# Patient Record
Sex: Female | Born: 1945 | Race: White | Hispanic: No | Marital: Married | Smoking: Never smoker
Health system: Southern US, Community
[De-identification: ages and names within clinical notes are randomized; demographics above are authoritative.]

## PROBLEM LIST (undated history)

## (undated) DIAGNOSIS — Z87442 Personal history of urinary calculi: Secondary | ICD-10-CM

## (undated) HISTORY — PX: CYSTOSCOPY/URETEROSCOPY/HOLMIUM LASER/STENT PLACEMENT: SHX6546

## (undated) HISTORY — PX: ABDOMINAL HYSTERECTOMY: SHX81

---

## 2010-08-01 ENCOUNTER — Other Ambulatory Visit (INDEPENDENT_AMBULATORY_CARE_PROVIDER_SITE_OTHER): Payer: Self-pay | Admitting: *Deleted

## 2010-11-21 ENCOUNTER — Ambulatory Visit (HOSPITAL_COMMUNITY)
Admission: RE | Admit: 2010-11-21 | Discharge: 2010-11-21 | Disposition: A | Discharge status: Home / Self Care | Payer: Medicare Other | Source: Ambulatory Visit | Attending: Internal Medicine | Admitting: Internal Medicine

## 2010-11-21 ENCOUNTER — Encounter (HOSPITAL_BASED_OUTPATIENT_CLINIC_OR_DEPARTMENT_OTHER): Payer: Medicare Other | Admitting: Internal Medicine

## 2010-11-21 DIAGNOSIS — Z1211 Encounter for screening for malignant neoplasm of colon: Secondary | ICD-10-CM | POA: Insufficient documentation

## 2010-11-21 DIAGNOSIS — K6289 Other specified diseases of anus and rectum: Secondary | ICD-10-CM

## 2010-11-21 DIAGNOSIS — K648 Other hemorrhoids: Secondary | ICD-10-CM

## 2010-12-10 NOTE — Op Note (Signed)
  [name], [name]               ACCOUNT NO.:  [id number]  MEDICAL RECORD NO.:  [id number]           PATIENT TYPE:  O  LOCATION:  DAYP                          FACILITY:  [facility]  PHYSICIAN:  Lionel December, M.D.    DATE OF BIRTH:  [birth year]  DATE OF PROCEDURE:  11/21/2010 DATE OF DISCHARGE:                              OPERATIVE REPORT   PROCEDURE:  Colonoscopy.  INDICATION:  Cheryl Wagner is a 65 year old Caucasian female who is undergoing average risk screening colonoscopy.  Her last colonoscopy was over 12 years ago.  Procedure risks were reviewed with the patient.  Informed consent was obtained.  MEDICATIONS FOR CONSCIOUS SEDATION:  Demerol 50 mg IV and Versed 4 mg IV.  FINDINGS:  Procedure performed in endoscopy suite.  The patient's vital signs and O2 sat were monitored during the procedure and remained stable.  The patient was placed in the left lateral recumbent position and rectal examination was performed.  No abnormality noted on external or digital exam.  Pentax videoscope was placed through rectum and advanced under vision into sigmoid colon and beyond.  Preparation was excellent.  Scope was passed into cecum which was identified by appendiceal orifice and ileocecal valve.  Short segment of GI was also examined, it was normal.  Pictures were taken for the record.  As the scope was withdrawn, colonic mucosa was carefully examined and no polyps, tumor masses or diverticular changes were noted.  Rectal mucosa was normal.  Scope was retroflexed to examine anorectal junction and she had two small anal papillae and hemorrhoids above the dentate line. Endoscope was then withdrawn.  Withdrawal time was 10 minutes.  The patient tolerated the procedure well.  FINAL DIAGNOSIS:  Small internal hemorrhoids with 2 anal papillae, otherwise normal colonoscopy.  RECOMMENDATIONS: 1. High-fiber diet. 2. She may consider next screening exam in 10 years.     ______________________________ Lionel December, M.D.     NR/MEDQ  D:  11/21/2010  T:  11/21/2010  Job:  [id number]  cc:   Dr. Toney Reil, [place]  Electronically Signed by Lionel December M.D. on 12/10/2010 10:09:54 AM

## 2011-01-02 NOTE — Progress Notes (Signed)
error 

## 2013-09-07 ENCOUNTER — Encounter (HOSPITAL_COMMUNITY): Payer: Self-pay | Admitting: Emergency Medicine

## 2013-09-07 ENCOUNTER — Emergency Department (HOSPITAL_COMMUNITY)
Admission: EM | Admit: 2013-09-07 | Discharge: 2013-09-07 | Disposition: A | Discharge status: Home / Self Care | Payer: Medicare Other | Attending: Emergency Medicine | Admitting: Emergency Medicine

## 2013-09-07 DIAGNOSIS — Z4689 Encounter for fitting and adjustment of other specified devices: Secondary | ICD-10-CM

## 2013-09-07 DIAGNOSIS — M25539 Pain in unspecified wrist: Secondary | ICD-10-CM | POA: Insufficient documentation

## 2013-09-07 NOTE — ED Provider Notes (Signed)
CSN: [id number]     Arrival date & time 09/07/13  1421 History   First MD Initiated Contact with Patient 09/07/13 1459     Chief Complaint  Patient presents with  . Cast Problem     (Consider location/radiation/quality/duration/timing/severity/associated sxs/prior Treatment) HPI Comments: Cheryl Wagner is a 68 y.o. Female presenting with assistance with her cast.  She is being treated by Dr. Chaney MallingMortenson in [place] for a distal radial fracture of her left arm and has been in her current cast for the past 3 weeks.  She has developed increased swelling and pain in the hand and wrist this week which is constant and worsening.  She has tried to elevate the arm as much as possible without relief of this pain.  She has noticed discoloration of her fingers today.  She denies new injury.  She is supposed to see Dr. Chaney MallingMortenson in 5 days for a change to a short arm cast; she was unable to be seen by him today.     The history is provided by the patient.    History reviewed. No pertinent past medical history. Past Surgical History  Procedure Laterality Date  . Abdominal hysterectomy     No family history on file. History  Substance Use Topics  . Smoking status: Never Smoker   . Smokeless tobacco: Not on file  . Alcohol Use: Not on file   OB History   Grav Para Term Preterm Abortions TAB SAB Ect Mult Living                 Review of Systems  Constitutional: Negative for fever.  Musculoskeletal: Positive for arthralgias. Negative for myalgias.  Skin: Positive for color change.  Neurological: Negative for weakness and numbness.      Allergies  Codeine  Home Medications  No current outpatient prescriptions on file. BP 143/82  Pulse 75  Temp(Src) 97.9 F (36.6 C) (Oral)  Resp 18  Ht 5\' 3"  (1.6 m)  Wt 110 lb (49.896 kg)  BMI 19.49 kg/m2  SpO2 100% Physical Exam  Constitutional: She appears well-developed and well-nourished.  HENT:  Head: Atraumatic.  Neck: Normal range of  motion.  Cardiovascular:  Pulses equal bilaterally  Musculoskeletal: She exhibits edema and tenderness.  Pt presents in a left sugar tong cast.  Her fingers and distal hand have pitting edema. Cap refill is reduced at 6 seconds.  Dusky, mottled appearance of volar proximal fingers.    Neurological: She is alert. She has normal strength. She displays normal reflexes. No sensory deficit.  Skin: Skin is warm and dry.  Psychiatric: She has a normal mood and affect.    ED Course  Procedures (including critical care time)  Patients old cast was cut off.  New well padded sugar tong splint applied after edema and peripheral color returned to normal. Pt has less than 3 sec cap refill in fingertips with full radial pulse.  Distal sensation intact.  She has tenderness dorsal distal radius without deformity.     Labs Review Labs Reviewed - No data to display Imaging Review No results found.   EKG Interpretation None      MDM   Final diagnoses:  Encounter for cast check    Call placed to Dr. Chaney MallingMortenson.  Advised of pt presentation.  He recommended replacement of well padded sugar tong,  Will plan to see pt Monday as previously scheduled.  Pt was also given a sling to help with elevation.    The patient  appears reasonably screened and/or stabilized for discharge and I doubt any other medical condition or other [facility] requiring further screening, evaluation, or treatment in the ED at this time prior to discharge.     Burgess Amor, PA-C 09/07/13 2045

## 2013-09-07 NOTE — ED Notes (Signed)
Swelling of fingers left hand, has cast in place, cast applied in [place] by Dr Jon BillingsMorrison. Cast applied 3 weeks ago.

## 2013-09-08 NOTE — ED Provider Notes (Signed)
Medical screening examination/treatment/procedure(s) were performed by non-physician practitioner and as supervising physician I was immediately available for consultation/collaboration.   EKG Interpretation None       Cheryl HutchingBrian Damika Harmon, MD 09/08/13 0045

## 2015-07-09 DIAGNOSIS — H2512 Age-related nuclear cataract, left eye: Secondary | ICD-10-CM | POA: Diagnosis not present

## 2015-07-09 DIAGNOSIS — H25012 Cortical age-related cataract, left eye: Secondary | ICD-10-CM | POA: Diagnosis not present

## 2015-07-17 DIAGNOSIS — H2512 Age-related nuclear cataract, left eye: Secondary | ICD-10-CM | POA: Diagnosis not present

## 2016-05-01 DIAGNOSIS — H26493 Other secondary cataract, bilateral: Secondary | ICD-10-CM | POA: Diagnosis not present

## 2016-05-01 DIAGNOSIS — Z961 Presence of intraocular lens: Secondary | ICD-10-CM | POA: Diagnosis not present

## 2016-05-01 DIAGNOSIS — H1131 Conjunctival hemorrhage, right eye: Secondary | ICD-10-CM | POA: Diagnosis not present

## 2016-06-02 DIAGNOSIS — Z299 Encounter for prophylactic measures, unspecified: Secondary | ICD-10-CM | POA: Diagnosis not present

## 2016-06-02 DIAGNOSIS — Z6821 Body mass index (BMI) 21.0-21.9, adult: Secondary | ICD-10-CM | POA: Diagnosis not present

## 2016-06-02 DIAGNOSIS — Z1211 Encounter for screening for malignant neoplasm of colon: Secondary | ICD-10-CM | POA: Diagnosis not present

## 2016-06-02 DIAGNOSIS — E7800 Pure hypercholesterolemia, unspecified: Secondary | ICD-10-CM | POA: Diagnosis not present

## 2016-06-02 DIAGNOSIS — Z0000 Encounter for general adult medical examination without abnormal findings: Secondary | ICD-10-CM | POA: Diagnosis not present

## 2016-06-02 DIAGNOSIS — R5383 Other fatigue: Secondary | ICD-10-CM | POA: Diagnosis not present

## 2016-06-02 DIAGNOSIS — Z1389 Encounter for screening for other disorder: Secondary | ICD-10-CM | POA: Diagnosis not present

## 2016-06-02 DIAGNOSIS — Z7189 Other specified counseling: Secondary | ICD-10-CM | POA: Diagnosis not present

## 2016-06-04 DIAGNOSIS — Z23 Encounter for immunization: Secondary | ICD-10-CM | POA: Diagnosis not present

## 2016-06-11 DIAGNOSIS — E2839 Other primary ovarian failure: Secondary | ICD-10-CM | POA: Diagnosis not present

## 2016-06-18 DIAGNOSIS — M8580 Other specified disorders of bone density and structure, unspecified site: Secondary | ICD-10-CM | POA: Diagnosis not present

## 2016-06-18 DIAGNOSIS — Z299 Encounter for prophylactic measures, unspecified: Secondary | ICD-10-CM | POA: Diagnosis not present

## 2016-06-18 DIAGNOSIS — E039 Hypothyroidism, unspecified: Secondary | ICD-10-CM | POA: Diagnosis not present

## 2016-06-18 DIAGNOSIS — E782 Mixed hyperlipidemia: Secondary | ICD-10-CM | POA: Diagnosis not present

## 2016-07-01 DIAGNOSIS — H43393 Other vitreous opacities, bilateral: Secondary | ICD-10-CM | POA: Diagnosis not present

## 2016-07-01 DIAGNOSIS — Z1231 Encounter for screening mammogram for malignant neoplasm of breast: Secondary | ICD-10-CM | POA: Diagnosis not present

## 2016-12-13 DIAGNOSIS — J029 Acute pharyngitis, unspecified: Secondary | ICD-10-CM | POA: Diagnosis not present

## 2016-12-13 DIAGNOSIS — J0100 Acute maxillary sinusitis, unspecified: Secondary | ICD-10-CM | POA: Diagnosis not present

## 2017-06-03 DIAGNOSIS — Z1231 Encounter for screening mammogram for malignant neoplasm of breast: Secondary | ICD-10-CM | POA: Diagnosis not present

## 2017-06-03 DIAGNOSIS — E7800 Pure hypercholesterolemia, unspecified: Secondary | ICD-10-CM | POA: Diagnosis not present

## 2017-06-03 DIAGNOSIS — Z299 Encounter for prophylactic measures, unspecified: Secondary | ICD-10-CM | POA: Diagnosis not present

## 2017-06-03 DIAGNOSIS — Z6821 Body mass index (BMI) 21.0-21.9, adult: Secondary | ICD-10-CM | POA: Diagnosis not present

## 2017-06-03 DIAGNOSIS — Z7189 Other specified counseling: Secondary | ICD-10-CM | POA: Diagnosis not present

## 2017-06-03 DIAGNOSIS — Z1339 Encounter for screening examination for other mental health and behavioral disorders: Secondary | ICD-10-CM | POA: Diagnosis not present

## 2017-06-03 DIAGNOSIS — Z0000 Encounter for general adult medical examination without abnormal findings: Secondary | ICD-10-CM | POA: Diagnosis not present

## 2017-06-03 DIAGNOSIS — Z23 Encounter for immunization: Secondary | ICD-10-CM | POA: Diagnosis not present

## 2017-06-03 DIAGNOSIS — Z1331 Encounter for screening for depression: Secondary | ICD-10-CM | POA: Diagnosis not present

## 2017-06-03 DIAGNOSIS — R5383 Other fatigue: Secondary | ICD-10-CM | POA: Diagnosis not present

## 2017-06-03 DIAGNOSIS — Z1211 Encounter for screening for malignant neoplasm of colon: Secondary | ICD-10-CM | POA: Diagnosis not present

## 2017-06-10 DIAGNOSIS — Z23 Encounter for immunization: Secondary | ICD-10-CM | POA: Diagnosis not present

## 2018-06-04 DIAGNOSIS — Z23 Encounter for immunization: Secondary | ICD-10-CM | POA: Diagnosis not present

## 2018-06-09 DIAGNOSIS — Z1211 Encounter for screening for malignant neoplasm of colon: Secondary | ICD-10-CM | POA: Diagnosis not present

## 2018-06-09 DIAGNOSIS — Z1331 Encounter for screening for depression: Secondary | ICD-10-CM | POA: Diagnosis not present

## 2018-06-09 DIAGNOSIS — Z6821 Body mass index (BMI) 21.0-21.9, adult: Secondary | ICD-10-CM | POA: Diagnosis not present

## 2018-06-09 DIAGNOSIS — Z79899 Other long term (current) drug therapy: Secondary | ICD-10-CM | POA: Diagnosis not present

## 2018-06-09 DIAGNOSIS — R5383 Other fatigue: Secondary | ICD-10-CM | POA: Diagnosis not present

## 2018-06-09 DIAGNOSIS — Z299 Encounter for prophylactic measures, unspecified: Secondary | ICD-10-CM | POA: Diagnosis not present

## 2018-06-09 DIAGNOSIS — E039 Hypothyroidism, unspecified: Secondary | ICD-10-CM | POA: Diagnosis not present

## 2018-06-09 DIAGNOSIS — Z1339 Encounter for screening examination for other mental health and behavioral disorders: Secondary | ICD-10-CM | POA: Diagnosis not present

## 2018-06-09 DIAGNOSIS — E7800 Pure hypercholesterolemia, unspecified: Secondary | ICD-10-CM | POA: Diagnosis not present

## 2018-06-09 DIAGNOSIS — Z0000 Encounter for general adult medical examination without abnormal findings: Secondary | ICD-10-CM | POA: Diagnosis not present

## 2018-06-09 DIAGNOSIS — Z7189 Other specified counseling: Secondary | ICD-10-CM | POA: Diagnosis not present

## 2018-07-29 DIAGNOSIS — E2839 Other primary ovarian failure: Secondary | ICD-10-CM | POA: Diagnosis not present

## 2018-09-09 DIAGNOSIS — H02055 Trichiasis without entropian left lower eyelid: Secondary | ICD-10-CM | POA: Diagnosis not present

## 2019-05-06 DIAGNOSIS — Z23 Encounter for immunization: Secondary | ICD-10-CM | POA: Diagnosis not present

## 2020-04-06 ENCOUNTER — Emergency Department (HOSPITAL_COMMUNITY): Payer: Medicare Other | Admitting: Anesthesiology

## 2020-04-06 ENCOUNTER — Emergency Department (HOSPITAL_COMMUNITY): Payer: Medicare Other

## 2020-04-06 ENCOUNTER — Encounter (HOSPITAL_COMMUNITY): Payer: Self-pay

## 2020-04-06 ENCOUNTER — Other Ambulatory Visit: Payer: Self-pay

## 2020-04-06 ENCOUNTER — Encounter (HOSPITAL_COMMUNITY)
Admission: EM | Disposition: A | Discharge status: Home / Self Care | Payer: Self-pay | Source: Home / Self Care | Attending: Emergency Medicine

## 2020-04-06 ENCOUNTER — Observation Stay (HOSPITAL_COMMUNITY)
Admission: EM | Admit: 2020-04-06 | Discharge: 2020-04-07 | Disposition: A | Discharge status: Home / Self Care | Payer: Medicare Other | Attending: Urology | Admitting: Urology

## 2020-04-06 DIAGNOSIS — N201 Calculus of ureter: Principal | ICD-10-CM | POA: Diagnosis present

## 2020-04-06 DIAGNOSIS — R109 Unspecified abdominal pain: Secondary | ICD-10-CM

## 2020-04-06 DIAGNOSIS — R8281 Pyuria: Secondary | ICD-10-CM | POA: Insufficient documentation

## 2020-04-06 DIAGNOSIS — Z20822 Contact with and (suspected) exposure to covid-19: Secondary | ICD-10-CM | POA: Insufficient documentation

## 2020-04-06 HISTORY — PX: CYSTOSCOPY WITH STENT PLACEMENT: SHX5790

## 2020-04-06 LAB — CBC WITH DIFFERENTIAL/PLATELET
Abs Immature Granulocytes: 0.04 10*3/uL (ref 0.00–0.07)
Basophils Absolute: 0 10*3/uL (ref 0.0–0.1)
Basophils Relative: 0 %
Eosinophils Absolute: 0 10*3/uL (ref 0.0–0.5)
Eosinophils Relative: 0 %
HCT: 37.8 % (ref 36.0–46.0)
Hemoglobin: 12.8 g/dL (ref 12.0–15.0)
Immature Granulocytes: 0 %
Lymphocytes Relative: 13 %
Lymphs Abs: 1.3 10*3/uL (ref 0.7–4.0)
MCH: 30.9 pg (ref 26.0–34.0)
MCHC: 33.9 g/dL (ref 30.0–36.0)
MCV: 91.3 fL (ref 80.0–100.0)
Monocytes Absolute: 0.9 10*3/uL (ref 0.1–1.0)
Monocytes Relative: 9 %
Neutro Abs: 8.1 10*3/uL — ABNORMAL HIGH (ref 1.7–7.7)
Neutrophils Relative %: 78 %
Platelets: 271 10*3/uL (ref 150–400)
RBC: 4.14 MIL/uL (ref 3.87–5.11)
RDW: 13.5 % (ref 11.5–15.5)
WBC: 10.4 10*3/uL (ref 4.0–10.5)
nRBC: 0 % (ref 0.0–0.2)

## 2020-04-06 LAB — URINALYSIS, ROUTINE W REFLEX MICROSCOPIC
Bilirubin Urine: NEGATIVE
Glucose, UA: NEGATIVE mg/dL
Ketones, ur: NEGATIVE mg/dL
Nitrite: NEGATIVE
Protein, ur: NEGATIVE mg/dL
RBC / HPF: >50 RBC/hpf — ABNORMAL HIGH (ref 0–5)
Specific Gravity, Urine: 1.02 (ref 1.005–1.030)
pH: 5 (ref 5.0–8.0)

## 2020-04-06 LAB — RESPIRATORY PANEL BY RT PCR (FLU A&B, COVID)
Influenza A by PCR: NEGATIVE
Influenza B by PCR: NEGATIVE
SARS Coronavirus 2 by RT PCR: NEGATIVE

## 2020-04-06 LAB — BASIC METABOLIC PANEL
Anion gap: 12 (ref 5–15)
Anion gap: 8 (ref 5–15)
BUN: 11 mg/dL (ref 8–23)
BUN: 10 mg/dL (ref 8–23)
CO2: 26 mmol/L (ref 22–32)
CO2: 27 mmol/L (ref 22–32)
Calcium: 9.2 mg/dL (ref 8.9–10.3)
Calcium: 8.7 mg/dL — ABNORMAL LOW (ref 8.9–10.3)
Chloride: 99 mmol/L (ref 98–111)
Chloride: 105 mmol/L (ref 98–111)
Creatinine, Ser: 0.78 mg/dL (ref 0.44–1.00)
Creatinine, Ser: 0.73 mg/dL (ref 0.44–1.00)
GFR, Estimated: >60 mL/min (ref >60)
GFR, Estimated: >60 mL/min (ref >60)
Glucose, Bld: 108 mg/dL — ABNORMAL HIGH (ref 70–99)
Glucose, Bld: 121 mg/dL — ABNORMAL HIGH (ref 70–99)
Potassium: 3.7 mmol/L (ref 3.5–5.1)
Potassium: 3.7 mmol/L (ref 3.5–5.1)
Sodium: 137 mmol/L (ref 135–145)
Sodium: 140 mmol/L (ref 135–145)

## 2020-04-06 LAB — CBC
HCT: 36.7 % (ref 36.0–46.0)
Hemoglobin: 12.2 g/dL (ref 12.0–15.0)
MCH: 30.6 pg (ref 26.0–34.0)
MCHC: 33.2 g/dL (ref 30.0–36.0)
MCV: 92 fL (ref 80.0–100.0)
Platelets: 262 10*3/uL (ref 150–400)
RBC: 3.99 MIL/uL (ref 3.87–5.11)
RDW: 13.4 % (ref 11.5–15.5)
WBC: 9.5 10*3/uL (ref 4.0–10.5)
nRBC: 0 % (ref 0.0–0.2)

## 2020-04-06 SURGERY — CYSTOSCOPY, WITH STENT INSERTION
Anesthesia: General | Site: Ureter | Laterality: Left

## 2020-04-06 MED ORDER — DIPHENHYDRAMINE HCL 12.5 MG/5ML PO ELIX: 12.5000 mg | ORAL_SOLUTION | Freq: Four times a day (QID) | ORAL | Status: DC | PRN

## 2020-04-06 MED ORDER — IOHEXOL 300 MG/ML  SOLN
INTRAMUSCULAR | Status: DC | PRN
  Administered 2020-04-06: 6 mL

## 2020-04-06 MED ORDER — FENTANYL CITRATE (PF) 100 MCG/2ML IJ SOLN: 25.0000 ug | INTRAMUSCULAR | Status: DC | PRN

## 2020-04-06 MED ORDER — SODIUM CHLORIDE 0.9 % IV SOLN
2.0000 g | INTRAVENOUS | Status: DC
  Administered 2020-04-07: 2 g via INTRAVENOUS
  Filled 2020-04-06: qty 2

## 2020-04-06 MED ORDER — FENTANYL CITRATE (PF) 100 MCG/2ML IJ SOLN
INTRAMUSCULAR | Status: AC
  Filled 2020-04-06: qty 2

## 2020-04-06 MED ORDER — EPHEDRINE SULFATE-NACL 50-0.9 MG/10ML-% IV SOSY
PREFILLED_SYRINGE | INTRAVENOUS | Status: DC | PRN
  Administered 2020-04-06 (×2): 10 mg via INTRAVENOUS

## 2020-04-06 MED ORDER — MEPERIDINE HCL 50 MG/ML IJ SOLN: 6.2500 mg | INTRAMUSCULAR | Status: DC | PRN

## 2020-04-06 MED ORDER — STERILE WATER FOR IRRIGATION IR SOLN
Status: DC | PRN
  Administered 2020-04-06: 3000 mL

## 2020-04-06 MED ORDER — SODIUM CHLORIDE 0.9 % IV SOLN
1.0000 g | INTRAVENOUS | Status: DC
  Administered 2020-04-06: 1 g via INTRAVENOUS
  Filled 2020-04-06: qty 10

## 2020-04-06 MED ORDER — LIDOCAINE 2% (20 MG/ML) 5 ML SYRINGE
INTRAMUSCULAR | Status: DC | PRN
  Administered 2020-04-06: 100 mg via INTRAVENOUS

## 2020-04-06 MED ORDER — PROPOFOL 10 MG/ML IV BOLUS
INTRAVENOUS | Status: DC | PRN
  Administered 2020-04-06: 150 mg via INTRAVENOUS

## 2020-04-06 MED ORDER — ONDANSETRON HCL 4 MG/2ML IJ SOLN: 4.0000 mg | Freq: Once | INTRAMUSCULAR | Status: DC | PRN

## 2020-04-06 MED ORDER — LACTATED RINGERS IV SOLN: INTRAVENOUS | Status: DC | PRN

## 2020-04-06 MED ORDER — SODIUM CHLORIDE 0.9 % IV SOLN: INTRAVENOUS | Status: DC

## 2020-04-06 MED ORDER — DEXAMETHASONE SODIUM PHOSPHATE 10 MG/ML IJ SOLN
INTRAMUSCULAR | Status: DC | PRN
  Administered 2020-04-06: 4 mg via INTRAVENOUS

## 2020-04-06 MED ORDER — HYDROMORPHONE HCL 1 MG/ML IJ SOLN
0.5000 mg | Freq: Once | INTRAMUSCULAR | Status: AC
Start: 2020-04-06 — End: 2020-04-06
  Administered 2020-04-06: 0.5 mg via INTRAVENOUS
  Filled 2020-04-06: qty 1

## 2020-04-06 MED ORDER — ONDANSETRON HCL 4 MG/2ML IJ SOLN
INTRAMUSCULAR | Status: DC | PRN
  Administered 2020-04-06: 4 mg via INTRAVENOUS

## 2020-04-06 MED ORDER — DIPHENHYDRAMINE HCL 50 MG/ML IJ SOLN: 12.5000 mg | Freq: Four times a day (QID) | INTRAMUSCULAR | Status: DC | PRN

## 2020-04-06 MED ORDER — PHENYLEPHRINE 40 MCG/ML (10ML) SYRINGE FOR IV PUSH (FOR BLOOD PRESSURE SUPPORT)
PREFILLED_SYRINGE | INTRAVENOUS | Status: DC | PRN
  Administered 2020-04-06: 80 ug via INTRAVENOUS

## 2020-04-06 MED ORDER — ACETAMINOPHEN 325 MG PO TABS: 650.0000 mg | ORAL_TABLET | ORAL | Status: DC | PRN

## 2020-04-06 MED ORDER — ONDANSETRON HCL 4 MG/2ML IJ SOLN
4.0000 mg | Freq: Once | INTRAMUSCULAR | Status: AC
  Administered 2020-04-06: 4 mg via INTRAVENOUS
  Filled 2020-04-06: qty 2

## 2020-04-06 MED ORDER — ONDANSETRON HCL 4 MG/2ML IJ SOLN: 4.0000 mg | INTRAMUSCULAR | Status: DC | PRN

## 2020-04-06 MED ORDER — FENTANYL CITRATE (PF) 100 MCG/2ML IJ SOLN
INTRAMUSCULAR | Status: DC | PRN
  Administered 2020-04-06: 50 ug via INTRAVENOUS

## 2020-04-06 MED ORDER — BELLADONNA ALKALOIDS-OPIUM 16.2-60 MG RE SUPP: 1.0000 | Freq: Four times a day (QID) | RECTAL | Status: DC | PRN

## 2020-04-06 MED ORDER — HYDROCODONE-ACETAMINOPHEN 5-325 MG PO TABS: 1.0000 | ORAL_TABLET | ORAL | Status: DC | PRN

## 2020-04-06 SURGICAL SUPPLY — 12 items
BAG URO CATCHER STRL LF (MISCELLANEOUS) ×3 IMPLANT
CATH INTERMIT  6FR 70CM (CATHETERS) ×3 IMPLANT
CLOTH BEACON ORANGE TIMEOUT ST (SAFETY) ×3 IMPLANT
GLOVE BIOGEL M 8.0 STRL (GLOVE) ×3 IMPLANT
GOWN STRL REUS W/TWL XL LVL3 (GOWN DISPOSABLE) ×6 IMPLANT
GUIDEWIRE STR DUAL SENSOR (WIRE) ×3 IMPLANT
KIT TURNOVER KIT A (KITS) IMPLANT
MANIFOLD NEPTUNE II (INSTRUMENTS) ×3 IMPLANT
PACK CYSTO (CUSTOM PROCEDURE TRAY) ×3 IMPLANT
STENT URET 6FRX24 CONTOUR (STENTS) ×3 IMPLANT
TUBING CONNECTING 10 (TUBING) ×2 IMPLANT
TUBING CONNECTING 10' (TUBING) ×1

## 2020-04-06 NOTE — ED Notes (Signed)
Pt OTF to short stay

## 2020-04-06 NOTE — Anesthesia Procedure Notes (Addendum)
Procedure Name: LMA Insertion Date/Time: 04/06/2020 12:26 PM Performed by: Wynonia Sours, CRNA Pre-anesthesia Checklist: Patient identified, Emergency Drugs available, Suction available, Patient being monitored and Timeout performed Patient Re-evaluated:Patient Re-evaluated prior to induction Oxygen Delivery Method: Circle system utilized Preoxygenation: Pre-oxygenation with 100% oxygen Induction Type: IV induction LMA: LMA with gastric port inserted LMA Size: 3.0 Number of attempts: 1 Placement Confirmation: positive ETCO2 Tube secured with: Tape Dental Injury: Teeth and Oropharynx as per pre-operative assessment

## 2020-04-06 NOTE — ED Provider Notes (Signed)
[name] Provider Note   CSN: [id number] Arrival date & time: 04/06/20  5643     History Chief Complaint  Patient presents with  . Flank Pain    Cheryl Wagner is a 74 y.o. female.  74 year old female who was diagnosed with a 6 mm stone causing hydronephrosis yesterday presents with ongoing nausea and left leg pain.  Denies any dysuria or hematuria.  Has been using oxycodone with some relief.  No prior history of kidney stones.  Called her doctor and told to come in for further management        History reviewed. No pertinent past medical history.  There are no problems to display for this patient.   Past Surgical History:  Procedure Laterality Date  . ABDOMINAL HYSTERECTOMY       OB History   No obstetric history on file.     History reviewed. No pertinent family history.  Social History   Tobacco Use  . Smoking status: Never Smoker  . Smokeless tobacco: Never Used  Substance Use Topics  . Alcohol use: Not on file  . Drug use: Not on file    Home Medications Prior to Admission medications   Medication Sig Start Date End Date Taking? Authorizing Provider  HYDROcodone-acetaminophen (NORCO/VICODIN) 5-325 MG tablet Take 1 tablet by mouth 4 (four) times daily as needed for pain. 04/05/20   [provider]  metroNIDAZOLE (FLAGYL) 500 MG tablet Take 500 mg by mouth 2 (two) times daily. 7 day supply 04/05/20   [provider]    Allergies    Codeine  Review of Systems   Review of Systems  All other systems reviewed and are negative.   Physical Exam Updated Vital Signs BP 127/64 (BP Location: Left Arm)   Pulse 77   Temp 98.2 F (36.8 C) (Oral)   Resp 16   Ht 1.6 m (5\' 3" )   Wt 49 kg   SpO2 97%   BMI 19.14 kg/m   Physical Exam Vitals and nursing note reviewed.  Constitutional:      General: She is not in acute distress.    Appearance: Normal appearance. She is well-developed. She is not  toxic-appearing.  HENT:     Head: Normocephalic and atraumatic.  Eyes:     General: Lids are normal.     Conjunctiva/sclera: Conjunctivae normal.     Pupils: Pupils are equal, round, and reactive to light.  Neck:     Thyroid: No thyroid mass.     Trachea: No tracheal deviation.  Cardiovascular:     Rate and Rhythm: Normal rate and regular rhythm.     Heart sounds: Normal heart sounds. No murmur heard.  No gallop.   Pulmonary:     Effort: Pulmonary effort is normal. No respiratory distress.     Breath sounds: Normal breath sounds. No stridor. No decreased breath sounds, wheezing, rhonchi or rales.  Abdominal:     General: Bowel sounds are normal. There is no distension.     Palpations: Abdomen is soft.     Tenderness: There is no abdominal tenderness. There is no rebound.  Musculoskeletal:        General: No tenderness. Normal range of motion.     Cervical back: Normal range of motion and neck supple.  Skin:    General: Skin is warm and dry.     Findings: No abrasion or rash.  Neurological:     Mental Status: She is alert and oriented to  person, place, and time.     GCS: GCS eye subscore is 4. GCS verbal subscore is 5. GCS motor subscore is 6.     Cranial Nerves: No cranial nerve deficit.     Sensory: No sensory deficit.  Psychiatric:        Speech: Speech normal.        Behavior: Behavior normal.     ED Results / Procedures / Treatments   Labs (all labs ordered are listed, but only abnormal results are displayed) Labs Reviewed  URINE CULTURE  CBC WITH DIFFERENTIAL/PLATELET  BASIC METABOLIC PANEL  URINALYSIS, ROUTINE W REFLEX MICROSCOPIC    EKG None  Radiology No results found.  Procedures Procedures (including critical care time)  Medications Ordered in ED Medications  0.9 %  sodium chloride infusion (has no administration in time range)    ED Course  I have reviewed the triage vital signs and the nursing notes.  Pertinent labs & imaging results that  were available during my care of the patient were reviewed by me and considered in my medical decision making (see chart for details).    MDM Rules/Calculators/A&P                          Patient medicated for pain here.  Urinalysis shows infection.  Patient started on IV Rocephin.  Discussed with urologist on-call, Dr. Ronne Binning, who will come and see patient Final Clinical Impression(s) / ED Diagnoses Final diagnoses:  None    Rx / DC Orders ED Discharge Orders    None       Lorre Nick, MD 04/06/20 [phone]

## 2020-04-06 NOTE — Op Note (Signed)
.  Preoperative diagnosis: Left ureteral stone  Postoperative diagnosis: Same  Procedure: 1 cystoscopy 2. Left retrograde pyelography 3.  Intraoperative fluoroscopy, under one hour, with interpretation 4. Left 6 x 26 JJ stent placement  Attending: Wilkie Aye  Anesthesia: General  Estimated blood loss: None  Drains: Left 6 x 26 JJ ureteral stent without tether, 16 French foley catheter  Specimens: none  Antibiotics: rocephin  Findings: left proximal ureteral stone. Moderate hydronephrosis. Purulent drainage from left renal pelvis after stent placement. No masses/lesions in the bladder. Ureteral orifices in normal anatomic location.  Indications: Patient is a 74 year old female with a history of left ureteral stone and concern for sepsis.  After discussing treatment options, they decided proceed with left stent placement.  Procedure her in detail: The patient was brought to the operating room and a brief timeout was done to ensure correct patient, correct procedure, correct site.  General anesthesia was administered patient was placed in dorsal lithotomy position.  Their genitalia was then prepped and draped in usual sterile fashion.  A rigid 22 French cystoscope was passed in the urethra and the bladder.  Bladder was inspected free masses or lesions.  the ureteral orifices were in the normal orthotopic locations.  a 6 french ureteral catheter was then instilled into the left ureteral orifice.  a gentle retrograde was obtained and findings noted above.  we then placed a zip wire through the ureteral catheter and advanced up to the renal pelvis.    We then placed a 6 x 26 double-j ureteral stent over the original zip wire.  We then removed the wire and good coil was noted in the the renal pelvis under fluoroscopy and the bladder under direct vision.  A foley catheter was then placed. the bladder was then drained and this concluded the procedure which was well tolerated by  patient.  Complications: None  Condition: Stable, extubated, transferred to PACU  Plan: Patient is to be admitted for IV antibiotics.

## 2020-04-06 NOTE — H&P (View-Only) (Signed)
Urology Consult  Referring physician: Dr. Freida Busman Reason for referral: left ureteral calculus, pyuria  Chief Complaint: left flank pain  History of Present Illness: Cheryl Wagner is a 74yo who presented to the ER this morning with a 4 day history of left flank pain. Starting Monday she developed sharp, intermittent, severe left flank pain and nonradiating. She has associated nausea, no vomiting. She denies any LUTS. NO hematuria. She had a CT at [facility] on 04/05/2020 which showed a 24mm left proximal ureteral calculus. UA today is concerning for infection. No fevers. This is her first stone event. No blood thinners.   History reviewed. No pertinent past medical history. Past Surgical History:  Procedure Laterality Date  . ABDOMINAL HYSTERECTOMY      Medications: I have reviewed the patient's current medications. Allergies:  Allergies  Allergen Reactions  . Codeine Nausea Only    History reviewed. No pertinent family history. Social History:  reports that she has never smoked. She has never used smokeless tobacco. No history on file for alcohol use and drug use.  Review of Systems  Genitourinary: Positive for flank pain.  All other systems reviewed and are negative.   Physical Exam:  Vital signs in last 24 hours: Temp:  [98.2 F (36.8 C)] 98.2 F (36.8 C) (10/15 0826) Pulse Rate:  [74-77] 74 (10/15 1000) Resp:  [16-18] 18 (10/15 1000) BP: (125-127)/(64-71) 125/71 (10/15 1000) SpO2:  [97 %-100 %] 100 % (10/15 1000) Weight:  [49 kg] 49 kg (10/15 0831) Physical Exam Vitals and nursing note reviewed.  Constitutional:      Appearance: Normal appearance.  HENT:     Head: Normocephalic and atraumatic.     Nose: Nose normal. No congestion.  Eyes:     Extraocular Movements: Extraocular movements intact.     Pupils: Pupils are equal, round, and reactive to light.  Cardiovascular:     Rate and Rhythm: Normal rate and regular rhythm.  Pulmonary:     Effort: Pulmonary effort is  normal. No respiratory distress.  Abdominal:     General: Abdomen is flat. There is no distension.  Musculoskeletal:        General: No swelling. Normal range of motion.     Cervical back: Normal range of motion and neck supple.  Skin:    General: Skin is warm and dry.  Neurological:     General: No focal deficit present.     Mental Status: She is alert and oriented to person, place, and time.  Psychiatric:        Mood and Affect: Mood normal.        Behavior: Behavior normal.        Thought Content: Thought content normal.        Judgment: Judgment normal.     Laboratory Data:  Results for orders placed or performed during the hospital encounter of 04/06/20 (from the past 72 hour(s))  CBC with Differential/Platelet     Status: Abnormal   Collection Time: 04/06/20  8:40 AM  Result Value Ref Range   WBC 10.4 4.0 - 10.5 K/uL   RBC 4.14 3.87 - 5.11 MIL/uL   Hemoglobin 12.8 12.0 - 15.0 g/dL   HCT 06.2 36 - 46 %   MCV 91.3 80.0 - 100.0 fL   MCH 30.9 26.0 - 34.0 pg   MCHC 33.9 30.0 - 36.0 g/dL   RDW 37.6 28.3 - 15.1 %   Platelets 271 150 - 400 K/uL   nRBC 0.0 0.0 - 0.2 %  Neutrophils Relative % 78 %   Neutro Abs 8.1 (H) 1.7 - 7.7 K/uL   Lymphocytes Relative 13 %   Lymphs Abs 1.3 0.7 - 4.0 K/uL   Monocytes Relative 9 %   Monocytes Absolute 0.9 0.1 - 1.0 K/uL   Eosinophils Relative 0 %   Eosinophils Absolute 0.0 0.0 - 0.5 K/uL   Basophils Relative 0 %   Basophils Absolute 0.0 0.0 - 0.1 K/uL   Immature Granulocytes 0 %   Abs Immature Granulocytes 0.04 0.00 - 0.07 K/uL    Comment: Performed at [facility], [address] [place], [place] [address]  Basic metabolic panel     Status: Abnormal   Collection Time: 04/06/20  8:40 AM  Result Value Ref Range   Sodium 137 135 - 145 mmol/L   Potassium 3.7 3.5 - 5.1 mmol/L   Chloride 99 98 - 111 mmol/L   CO2 26 22 - 32 mmol/L   Glucose, Bld 108 (H) 70 - 99 mg/dL    Comment: Glucose reference range applies only  to samples taken after fasting for at least 8 hours.   BUN 11 8 - 23 mg/dL   Creatinine, Ser 2.69 0.44 - 1.00 mg/dL   Calcium 9.2 8.9 - 48.5 mg/dL   GFR, Estimated >46 >27 mL/min   Anion gap 12 5 - 15    Comment: Performed at [facility], [address] [place], [place] [address]  Urinalysis, Routine w reflex microscopic Urine, Clean Catch     Status: Abnormal   Collection Time: 04/06/20  8:40 AM  Result Value Ref Range   Color, Urine YELLOW YELLOW   APPearance CLEAR CLEAR   Specific Gravity, Urine 1.020 1.005 - 1.030   pH 5.0 5.0 - 8.0   Glucose, UA NEGATIVE NEGATIVE mg/dL   Hgb urine dipstick LARGE (A) NEGATIVE   Bilirubin Urine NEGATIVE NEGATIVE   Ketones, ur NEGATIVE NEGATIVE mg/dL   Protein, ur NEGATIVE NEGATIVE mg/dL   Nitrite NEGATIVE NEGATIVE   Leukocytes,Ua MODERATE (A) NEGATIVE   RBC / HPF >50 (H) 0 - 5 RBC/hpf   WBC, UA 21-50 0 - 5 WBC/hpf   Bacteria, UA MANY (A) NONE SEEN   Squamous Epithelial / LPF 0-5 0 - 5   Mucus PRESENT     Comment: Performed at [facility], [address] [place], [place] [address]   No results found for this or any previous visit (from the past 240 hour(s)). Creatinine: Recent Labs    04/06/20 0840  CREATININE 0.78   Baseline Creatinine: 0.8  Impression/Assessment:  74yo with left ureteral calculus, pyuria  Plan:  -We discussed the management of kidney stones. These options include observation, ureteroscopy, shockwave lithotripsy (ESWL) and percutaneous nephrolithotomy (PCNL). We discussed which options are relevant to the patient's stone(s). We discussed the natural history of kidney stones as well as the complications of untreated stones and the impact on quality of life without treatment as well as with each of the above listed treatments. We also discussed the efficacy of each treatment in its ability to clear the stone burden. With any of these management options I discussed the signs and  symptoms of infection and the need for emergent treatment should these be experienced. For each option we discussed the ability of each procedure to clear the patient of their stone burden.   For observation I described the risks which include but are not limited to silent renal damage, life-threatening infection, need for  emergent surgery, failure to pass stone and pain.   For ureteroscopy I described the risks which include bleeding, infection, damage to contiguous structures, positioning injury, ureteral stricture, ureteral avulsion, ureteral injury, need for prolonged ureteral stent, inability to perform ureteroscopy, need for an interval procedure, inability to clear stone burden, stent discomfort/pain, heart attack, stroke, pulmonary embolus and the inherent risks with general anesthesia.   For shockwave lithotripsy I described the risks which include arrhythmia, kidney contusion, kidney hemorrhage, need for transfusion, pain, inability to adequately break up stone, inability to pass stone fragments, Steinstrasse, infection associated with obstructing stones, need for alternate surgical procedure, need for repeat shockwave lithotripsy, MI, CVA, PE and the inherent risks with anesthesia/conscious sedation.   For PCNL I described the risks including positioning injury, pneumothorax, hydrothorax, need for chest tube, inability to clear stone burden, renal laceration, arterial venous fistula or malformation, need for embolization of kidney, loss of kidney or renal function, need for repeat procedure, need for prolonged nephrostomy tube, ureteral avulsion, MI, CVA, PE and the inherent risks of general anesthesia.   - The patient would like to proceed with Left ureteral stent placement  Wilkie Aye 04/06/2020, 10:54 AM

## 2020-04-06 NOTE — Anesthesia Preprocedure Evaluation (Signed)
Anesthesia Evaluation  Patient identified by MRN, date of birth, ID band Patient awake    Reviewed: Allergy & Precautions, NPO status , Patient's Chart, lab work & pertinent test results  Airway Mallampati: I       Dental no notable dental hx.    Pulmonary neg pulmonary ROS,    Pulmonary exam normal        Cardiovascular negative cardio ROS Normal cardiovascular exam     Neuro/Psych negative neurological ROS  negative psych ROS   GI/Hepatic negative GI ROS, Neg liver ROS,   Endo/Other  negative endocrine ROS  Renal/GU Renal disease  negative genitourinary   Musculoskeletal negative musculoskeletal ROS (+)   Abdominal Normal abdominal exam  (+)   Peds  Hematology negative hematology ROS (+)   Anesthesia Other Findings   Reproductive/Obstetrics                             Anesthesia Physical Anesthesia Plan  ASA: I  Anesthesia Plan: General   Post-op Pain Management:    Induction: Intravenous  PONV Risk Score and Plan: 4 or greater and Ondansetron, Dexamethasone and Midazolam  Airway Management Planned: LMA  Additional Equipment: None  Intra-op Plan:   Post-operative Plan: Extubation in OR  Informed Consent: I have reviewed the patients History and Physical, chart, labs and discussed the procedure including the risks, benefits and alternatives for the proposed anesthesia with the patient or authorized representative who has indicated his/her understanding and acceptance.     Dental advisory given  Plan Discussed with: CRNA  Anesthesia Plan Comments:         Anesthesia Quick Evaluation

## 2020-04-06 NOTE — Transfer of Care (Signed)
Immediate Anesthesia Transfer of Care Note  Patient: Cheryl Wagner  Procedure(s) Performed: Procedure(s): CYSTOSCOPY LEFT RETROGRADE  STENT PLACEMENT (Left)  Patient Location: PACU  Anesthesia Type:General  Level of Consciousness:  sedated, patient cooperative and responds to stimulation  Airway & Oxygen Therapy:Patient Spontanous Breathing and Patient connected to face mask oxgen  Post-op Assessment:  Report given to PACU RN and Post -op Vital signs reviewed and stable  Post vital signs:  Reviewed and stable  Last Vitals:  Vitals:   04/06/20 1000 04/06/20 1302  BP: 125/71   Pulse: 74   Resp: 18   Temp:  36.4 C  SpO2: 100%     Complications: No apparent anesthesia complications

## 2020-04-06 NOTE — Consult Note (Signed)
Urology Consult  Referring physician: Dr. Freida Busman Reason for referral: left ureteral calculus, pyuria  Chief Complaint: left flank pain  History of Present Illness: [name] is a 74yo who presented to the ER this morning with a 4 day history of left flank pain. Starting Monday she developed sharp, intermittent, severe left flank pain and nonradiating. She has associated nausea, no vomiting. She denies any LUTS. NO hematuria. She had a CT at [facility] on 04/05/2020 which showed a 24mm left proximal ureteral calculus. UA today is concerning for infection. No fevers. This is her first stone event. No blood thinners.   History reviewed. No pertinent past medical history. Past Surgical History:  Procedure Laterality Date  . ABDOMINAL HYSTERECTOMY      Medications: I have reviewed the patient's current medications. Allergies:  Allergies  Allergen Reactions  . Codeine Nausea Only    History reviewed. No pertinent family history. Social History:  reports that she has never smoked. She has never used smokeless tobacco. No history on file for alcohol use and drug use.  Review of Systems  Genitourinary: Positive for flank pain.  All other systems reviewed and are negative.   Physical Exam:  Vital signs in last 24 hours: Temp:  [98.2 F (36.8 C)] 98.2 F (36.8 C) (10/15 0826) Pulse Rate:  [74-77] 74 (10/15 1000) Resp:  [16-18] 18 (10/15 1000) BP: (125-127)/(64-71) 125/71 (10/15 1000) SpO2:  [97 %-100 %] 100 % (10/15 1000) Weight:  [49 kg] 49 kg (10/15 0831) Physical Exam Vitals and nursing note reviewed.  Constitutional:      Appearance: Normal appearance.  HENT:     Head: Normocephalic and atraumatic.     Nose: Nose normal. No congestion.  Eyes:     Extraocular Movements: Extraocular movements intact.     Pupils: Pupils are equal, round, and reactive to light.  Cardiovascular:     Rate and Rhythm: Normal rate and regular rhythm.  Pulmonary:     Effort: Pulmonary effort is  normal. No respiratory distress.  Abdominal:     General: Abdomen is flat. There is no distension.  Musculoskeletal:        General: No swelling. Normal range of motion.     Cervical back: Normal range of motion and neck supple.  Skin:    General: Skin is warm and dry.  Neurological:     General: No focal deficit present.     Mental Status: She is alert and oriented to person, place, and time.  Psychiatric:        Mood and Affect: Mood normal.        Behavior: Behavior normal.        Thought Content: Thought content normal.        Judgment: Judgment normal.     Laboratory Data:  Results for orders placed or performed during the hospital encounter of 04/06/20 (from the past 72 hour(s))  CBC with Differential/Platelet     Status: Abnormal   Collection Time: 04/06/20  8:40 AM  Result Value Ref Range   WBC 10.4 4.0 - 10.5 K/uL   RBC 4.14 3.87 - 5.11 MIL/uL   Hemoglobin 12.8 12.0 - 15.0 g/dL   HCT 06.2 36 - 46 %   MCV 91.3 80.0 - 100.0 fL   MCH 30.9 26.0 - 34.0 pg   MCHC 33.9 30.0 - 36.0 g/dL   RDW 37.6 28.3 - 15.1 %   Platelets 271 150 - 400 K/uL   nRBC 0.0 0.0 - 0.2 %  Neutrophils Relative % 78 %   Neutro Abs 8.1 (H) 1.7 - 7.7 K/uL   Lymphocytes Relative 13 %   Lymphs Abs 1.3 0.7 - 4.0 K/uL   Monocytes Relative 9 %   Monocytes Absolute 0.9 0.1 - 1.0 K/uL   Eosinophils Relative 0 %   Eosinophils Absolute 0.0 0.0 - 0.5 K/uL   Basophils Relative 0 %   Basophils Absolute 0.0 0.0 - 0.1 K/uL   Immature Granulocytes 0 %   Abs Immature Granulocytes 0.04 0.00 - 0.07 K/uL    Comment: Performed at [facility], [address] [place], [place] [address]  Basic metabolic panel     Status: Abnormal   Collection Time: 04/06/20  8:40 AM  Result Value Ref Range   Sodium 137 135 - 145 mmol/L   Potassium 3.7 3.5 - 5.1 mmol/L   Chloride 99 98 - 111 mmol/L   CO2 26 22 - 32 mmol/L   Glucose, Bld 108 (H) 70 - 99 mg/dL    Comment: Glucose reference range applies only  to samples taken after fasting for at least 8 hours.   BUN 11 8 - 23 mg/dL   Creatinine, Ser 2.69 0.44 - 1.00 mg/dL   Calcium 9.2 8.9 - 48.5 mg/dL   GFR, Estimated >46 >27 mL/min   Anion gap 12 5 - 15    Comment: Performed at [facility], [address] [place], [place] [address]  Urinalysis, Routine w reflex microscopic Urine, Clean Catch     Status: Abnormal   Collection Time: 04/06/20  8:40 AM  Result Value Ref Range   Color, Urine YELLOW YELLOW   APPearance CLEAR CLEAR   Specific Gravity, Urine 1.020 1.005 - 1.030   pH 5.0 5.0 - 8.0   Glucose, UA NEGATIVE NEGATIVE mg/dL   Hgb urine dipstick LARGE (A) NEGATIVE   Bilirubin Urine NEGATIVE NEGATIVE   Ketones, ur NEGATIVE NEGATIVE mg/dL   Protein, ur NEGATIVE NEGATIVE mg/dL   Nitrite NEGATIVE NEGATIVE   Leukocytes,Ua MODERATE (A) NEGATIVE   RBC / HPF >50 (H) 0 - 5 RBC/hpf   WBC, UA 21-50 0 - 5 WBC/hpf   Bacteria, UA MANY (A) NONE SEEN   Squamous Epithelial / LPF 0-5 0 - 5   Mucus PRESENT     Comment: Performed at [facility], [address] [place], [place] [address]   No results found for this or any previous visit (from the past 240 hour(s)). Creatinine: Recent Labs    04/06/20 0840  CREATININE 0.78   Baseline Creatinine: 0.8  Impression/Assessment:  74yo with left ureteral calculus, pyuria  Plan:  -We discussed the management of kidney stones. These options include observation, ureteroscopy, shockwave lithotripsy (ESWL) and percutaneous nephrolithotomy (PCNL). We discussed which options are relevant to the patient's stone(s). We discussed the natural history of kidney stones as well as the complications of untreated stones and the impact on quality of life without treatment as well as with each of the above listed treatments. We also discussed the efficacy of each treatment in its ability to clear the stone burden. With any of these management options I discussed the signs and  symptoms of infection and the need for emergent treatment should these be experienced. For each option we discussed the ability of each procedure to clear the patient of their stone burden.   For observation I described the risks which include but are not limited to silent renal damage, life-threatening infection, need for  emergent surgery, failure to pass stone and pain.   For ureteroscopy I described the risks which include bleeding, infection, damage to contiguous structures, positioning injury, ureteral stricture, ureteral avulsion, ureteral injury, need for prolonged ureteral stent, inability to perform ureteroscopy, need for an interval procedure, inability to clear stone burden, stent discomfort/pain, heart attack, stroke, pulmonary embolus and the inherent risks with general anesthesia.   For shockwave lithotripsy I described the risks which include arrhythmia, kidney contusion, kidney hemorrhage, need for transfusion, pain, inability to adequately break up stone, inability to pass stone fragments, Steinstrasse, infection associated with obstructing stones, need for alternate surgical procedure, need for repeat shockwave lithotripsy, MI, CVA, PE and the inherent risks with anesthesia/conscious sedation.   For PCNL I described the risks including positioning injury, pneumothorax, hydrothorax, need for chest tube, inability to clear stone burden, renal laceration, arterial venous fistula or malformation, need for embolization of kidney, loss of kidney or renal function, need for repeat procedure, need for prolonged nephrostomy tube, ureteral avulsion, MI, CVA, PE and the inherent risks of general anesthesia.   - The patient would like to proceed with Left ureteral stent placement  Wilkie Aye 04/06/2020, 10:54 AM

## 2020-04-06 NOTE — ED Triage Notes (Signed)
Pt states left flank pain with n/v, went to have CT abd/pelvis yesterday and showed hydronephrosis and 48mm left urethral stone. MD told her to come to ED to obtain urologist

## 2020-04-07 LAB — CBC
HCT: 34.9 % — ABNORMAL LOW (ref 36.0–46.0)
Hemoglobin: 11.6 g/dL — ABNORMAL LOW (ref 12.0–15.0)
MCH: 30.4 pg (ref 26.0–34.0)
MCHC: 33.2 g/dL (ref 30.0–36.0)
MCV: 91.4 fL (ref 80.0–100.0)
Platelets: 258 10*3/uL (ref 150–400)
RBC: 3.82 MIL/uL — ABNORMAL LOW (ref 3.87–5.11)
RDW: 13.5 % (ref 11.5–15.5)
WBC: 8.1 10*3/uL (ref 4.0–10.5)
nRBC: 0 % (ref 0.0–0.2)

## 2020-04-07 LAB — BASIC METABOLIC PANEL
Anion gap: 9 (ref 5–15)
BUN: 10 mg/dL (ref 8–23)
CO2: 24 mmol/L (ref 22–32)
Calcium: 8.7 mg/dL — ABNORMAL LOW (ref 8.9–10.3)
Chloride: 107 mmol/L (ref 98–111)
Creatinine, Ser: 0.65 mg/dL (ref 0.44–1.00)
GFR, Estimated: >60 mL/min (ref >60)
Glucose, Bld: 88 mg/dL (ref 70–99)
Potassium: 3.6 mmol/L (ref 3.5–5.1)
Sodium: 140 mmol/L (ref 135–145)

## 2020-04-07 LAB — URINE CULTURE: Culture: >=100000 — AB

## 2020-04-07 MED ORDER — DOCUSATE SODIUM 100 MG PO CAPS
100.0000 mg | ORAL_CAPSULE | Freq: Two times a day (BID) | ORAL | Status: DC
  Administered 2020-04-07: 100 mg via ORAL
  Filled 2020-04-07: qty 1

## 2020-04-07 MED ORDER — SENNA 8.6 MG PO TABS: 1.0000 | ORAL_TABLET | Freq: Every day | ORAL | Status: DC

## 2020-04-07 MED ORDER — TAMSULOSIN HCL 0.4 MG PO CAPS: 0.4000 mg | ORAL_CAPSULE | Freq: Every day | ORAL | 0 refills | Status: DC

## 2020-04-07 MED ORDER — CEFPODOXIME PROXETIL 200 MG PO TABS: 200.0000 mg | ORAL_TABLET | Freq: Two times a day (BID) | ORAL | 0 refills | Status: AC

## 2020-04-07 MED ORDER — CHLORHEXIDINE GLUCONATE CLOTH 2 % EX PADS
6.0000 | MEDICATED_PAD | Freq: Every day | CUTANEOUS | Status: DC
  Administered 2020-04-07: 6 via TOPICAL

## 2020-04-07 MED ORDER — HYDROCODONE-ACETAMINOPHEN 5-325 MG PO TABS
1.0000 | ORAL_TABLET | ORAL | 0 refills | Status: DC | PRN
Start: 2020-04-07 — End: 2020-04-10

## 2020-04-07 NOTE — Progress Notes (Signed)
Pt to be discharged to home this afternoon. Pt has voided x 2 post foley removal last void 400 cc blood tinged urine. Pt and Pt's Daughter given discharge instructions including all Medications and schedules for these Medications Understanding verbalized. Discharge packet with pt at time of discharge

## 2020-04-07 NOTE — Discharge Instructions (Signed)

## 2020-04-07 NOTE — Discharge Summary (Signed)
Physician Discharge Summary  Patient ID: Cheryl Wagner MRN: [id number] DOB/AGE: [birth year] 74 y.o.  Admit date: 04/06/2020 Discharge date: 04/07/2020  Admission Diagnoses:  Ureteral calculus, pyuria  Discharge Diagnoses:  Active Problems:   Ureteral calculus   History reviewed. No pertinent past medical history.  Surgeries: Procedure(s): CYSTOSCOPY LEFT RETROGRADE  STENT PLACEMENT on 04/06/2020   Consultants (if any):   Discharged Condition: Improved  Hospital Course: Cheryl Wagner is an 74 y.o. female who was admitted 04/06/2020 with a diagnosis of ureteral calculus and went to the operating room on 04/06/2020 and underwent the above named procedures.    She was given perioperative antibiotics:  Anti-infectives (From admission, onward)   Start     Dose/Rate Route Frequency Ordered Stop   04/07/20 0800  cefTRIAXone (ROCEPHIN) 2 g in sodium chloride 0.9 % 100 mL IVPB        2 g 200 mL/hr over 30 Minutes Intravenous Every 24 hours 04/06/20 1424     04/07/20 0000  cefpodoxime (VANTIN) 200 MG tablet        200 mg Oral 2 times daily 04/07/20 0909     04/06/20 0915  cefTRIAXone (ROCEPHIN) 1 g in sodium chloride 0.9 % 100 mL IVPB  Status:  Discontinued        1 g 200 mL/hr over 30 Minutes Intravenous Every 24 hours 04/06/20 0911 04/06/20 1428    .  She was given sequential compression devices, early ambulation for DVT prophylaxis.  She benefited maximally from the hospital stay and there were no complications.    Recent vital signs:  Vitals:   04/06/20 2232 04/07/20 0155  BP: 111/65 127/71  Pulse: 73 76  Resp: 16 16  Temp: 98.4 F (36.9 C) 98.9 F (37.2 C)  SpO2: 97% 98%    Recent laboratory studies:  Lab Results  Component Value Date   HGB 11.6 (L) 04/07/2020   HGB 12.2 04/06/2020   HGB 12.8 04/06/2020   Lab Results  Component Value Date   WBC 8.1 04/07/2020   PLT 258 04/07/2020   No results found for: INR Lab Results  Component Value Date   NA 140  04/07/2020   K 3.6 04/07/2020   CL 107 04/07/2020   CO2 24 04/07/2020   BUN 10 04/07/2020   CREATININE 0.65 04/07/2020   GLUCOSE 88 04/07/2020    Discharge Medications:   Allergies as of 04/07/2020      Reactions   Codeine Nausea Only      Medication List    TAKE these medications   cefpodoxime 200 MG tablet Commonly known as: VANTIN Take 1 tablet (200 mg total) by mouth 2 (two) times daily.   fluticasone 50 MCG/ACT nasal spray Commonly known as: FLONASE Place 1-2 sprays into both nostrils daily as needed for allergies or rhinitis.   HYDROcodone-acetaminophen 5-325 MG tablet Commonly known as: NORCO/VICODIN Take 1 tablet by mouth every 4 (four) hours as needed for moderate pain. What changed:   when to take this  reasons to take this   ibuprofen 200 MG tablet Commonly known as: ADVIL Take 200 mg by mouth every 6 (six) hours as needed for fever, headache or mild pain.   KRILL OIL PO Take 1 tablet by mouth daily.   metroNIDAZOLE 500 MG tablet Commonly known as: FLAGYL Take 500 mg by mouth 2 (two) times daily. 7 day supply   multivitamin with minerals Tabs tablet Take 1 tablet by mouth daily.   naproxen sodium 220 MG  tablet Commonly known as: ALEVE Take 440 mg by mouth 2 (two) times daily as needed (headache/pain).   tamsulosin 0.4 MG Caps capsule Commonly known as: Flomax Take 1 capsule (0.4 mg total) by mouth daily after supper.   VITAMIN D PO Take 1 tablet by mouth daily.       Diagnostic Studies: DG C-Arm 1-60 Min-No Report  Result Date: 04/06/2020 Fluoroscopy was utilized by the requesting physician.  No radiographic interpretation.    Disposition: Discharge disposition: 01-Home or Self Care          Follow-up Information    Sam Overbeck, Mardene Celeste, MD. Call on 04/10/2020.   Specialty: Urology Contact information: [address]  [place] [address] [phone]                Signed: Wilkie Aye 04/07/2020, 9:11 AM

## 2020-04-09 ENCOUNTER — Encounter (HOSPITAL_COMMUNITY): Payer: Self-pay

## 2020-04-09 ENCOUNTER — Telehealth: Payer: Self-pay

## 2020-04-09 ENCOUNTER — Other Ambulatory Visit (HOSPITAL_COMMUNITY)
Admission: RE | Admit: 2020-04-09 | Discharge: 2020-04-09 | Disposition: A | Discharge status: Home / Self Care | Payer: Medicare Other | Source: Ambulatory Visit | Attending: Urology | Admitting: Urology

## 2020-04-09 ENCOUNTER — Encounter (HOSPITAL_COMMUNITY)
Admission: RE | Admit: 2020-04-09 | Discharge: 2020-04-09 | Disposition: A | Discharge status: Home / Self Care | Payer: Medicare Other | Source: Ambulatory Visit | Attending: Urology | Admitting: Urology

## 2020-04-09 ENCOUNTER — Other Ambulatory Visit: Payer: Self-pay

## 2020-04-09 DIAGNOSIS — Z01812 Encounter for preprocedural laboratory examination: Secondary | ICD-10-CM | POA: Insufficient documentation

## 2020-04-09 DIAGNOSIS — Z20822 Contact with and (suspected) exposure to covid-19: Secondary | ICD-10-CM | POA: Insufficient documentation

## 2020-04-09 HISTORY — DX: Personal history of urinary calculi: Z87.442

## 2020-04-09 LAB — SARS CORONAVIRUS 2 (TAT 6-24 HRS): SARS Coronavirus 2: NEGATIVE

## 2020-04-09 NOTE — Telephone Encounter (Signed)
-----   Message from Patrick L McKenzie, MD sent at 04/09/2020  7:49 AM EDT ----- °Can we schedule this lady for Left ESWl for tomorrow? I am going to talk to the OR ° °

## 2020-04-09 NOTE — Telephone Encounter (Signed)
Pt called and aware to come by the office for litho instructions.

## 2020-04-09 NOTE — Telephone Encounter (Signed)
Pt called again and left message regarding need to come by the office for Litho packet and consent.   I spoke with patient this am. Address was given and pt was aware she needed to come by the office.

## 2020-04-09 NOTE — Patient Instructions (Addendum)
Cheryl Wagner  04/09/2020     @PREFPERIOPPHARMACY @   Your procedure is scheduled on  04/10/2020.  Report to [facility] at  0900  A.M.  Call this number if you have problems the morning of surgery:  [phone]   Remember:  Do not eat or drink after midnight.                         Take these medicines the morning of surgery with A SIP OF WATER  Vantin, hydrocodone(if needed), flomax.    Do not wear jewelry, make-up or nail polish.  Do not wear lotions, powders, or perfume. Please wear deodorant and brush your teeth.  Do not shave 48 hours prior to surgery.  Men may shave face and neck.  Do not bring valuables to the hospital.  [facility] is not responsible for any belongings or valuables.  Contacts, dentures or bridgework may not be worn into surgery.  Leave your suitcase in the car.  After surgery it may be brought to your room.  For patients admitted to the hospital, discharge time will be determined by your treatment team.  Patients discharged the day of surgery will not be allowed to drive home.   Name and phone number of your driver:   family Special instructions:  DO NOT smoke the morning of your procedure.  Please read over the following fact sheets that you were given. Anesthesia Post-op Instructions and Care and Recovery After Surgery. MAKE SURE you bring your blue folder with you in the morning.      Lithotripsy, Care After This sheet gives you information about how to care for yourself after your procedure. Your health care provider may also give you more specific instructions. If you have problems or questions, contact your health care provider. What can I expect after the procedure? After the procedure, it is common to have:  Some blood in your urine. This should only last for a few days.  Soreness in your back, sides, or upper abdomen for a few days.  Blotches or bruises on your back where the pressure wave entered the skin.  Pain,  discomfort, or nausea when pieces (fragments) of the kidney stone move through the tube that carries urine from the kidney to the bladder (ureter). Stone fragments may pass soon after the procedure, but they may continue to pass for up to 4-8 weeks. ? If you have severe pain or nausea, contact your health care provider. This may be caused by a large stone that was not broken up, and this may mean that you need more treatment.  Some pain or discomfort during urination.  Some pain or discomfort in the lower abdomen or (in men) at the base of the penis. Follow these instructions at home: Medicines  Take over-the-counter and prescription medicines only as told by your health care provider.  If you were prescribed an antibiotic medicine, take it as told by your health care provider. Do not stop taking the antibiotic even if you start to feel better.  Do not drive for 24 hours if you were given a medicine to help you relax (sedative).  Do not drive or use heavy machinery while taking prescription pain medicine. Eating and drinking      Drink enough water and fluids to keep your urine clear or pale yellow. This helps any remaining pieces of the stone to pass. It can also help prevent new stones from  forming.  Eat plenty of fresh fruits and vegetables.  Follow instructions from your health care provider about eating and drinking restrictions. You may be instructed: ? To reduce how much salt (sodium) you eat or drink. Check ingredients and nutrition facts on packaged foods and beverages. ? To reduce how much meat you eat.  Eat the recommended amount of calcium for your age and gender. Ask your health care provider how much calcium you should have. General instructions  Get plenty of rest.  Most people can resume normal activities 1-2 days after the procedure. Ask your health care provider what activities are safe for you.  Your health care provider may direct you to lie in a certain  position (postural drainage) and tap firmly (percuss) over your kidney area to help stone fragments pass. Follow instructions as told by your health care provider.  If directed, strain all urine through the strainer that was provided by your health care provider. ? Keep all fragments for your health care provider to see. Any stones that are found may be sent to a medical lab for examination. The stone may be as small as a grain of salt.  Keep all follow-up visits as told by your health care provider. This is important. Contact a health care provider if:  You have pain that is severe or does not get better with medicine.  You have nausea that is severe or does not go away.  You have blood in your urine longer than your health care provider told you to expect.  You have more blood in your urine.  You have pain during urination that does not go away.  You urinate more frequently than usual and this does not go away.  You develop a rash or any other possible signs of an allergic reaction. Get help right away if:  You have severe pain in your back, sides, or upper abdomen.  You have severe pain while urinating.  Your urine is very dark red.  You have blood in your stool (feces).  You cannot pass any urine at all.  You feel a strong urge to urinate after emptying your bladder.  You have a fever or chills.  You develop shortness of breath, difficulty breathing, or chest pain.  You have severe nausea that leads to persistent vomiting.  You faint. Summary  After this procedure, it is common to have some pain, discomfort, or nausea when pieces (fragments) of the kidney stone move through the tube that carries urine from the kidney to the bladder (ureter). If this pain or nausea is severe, however, you should contact your health care provider.  Most people can resume normal activities 1-2 days after the procedure. Ask your health care provider what activities are safe for  you.  Drink enough water and fluids to keep your urine clear or pale yellow. This helps any remaining pieces of the stone to pass, and it can help prevent new stones from forming.  If directed, strain your urine and keep all fragments for your health care provider to see. Fragments or stones may be as small as a grain of salt.  Get help right away if you have severe pain in your back, sides, or upper abdomen or have severe pain while urinating. This information is not intended to replace advice given to you by your health care provider. Make sure you discuss any questions you have with your health care provider. Document Revised: 09/20/2018 Document Reviewed: 04/30/2016 Elsevier Patient Education  [phone]  [employer].

## 2020-04-09 NOTE — Telephone Encounter (Signed)
-----   Message from Malen Gauze, MD sent at 04/09/2020  7:49 AM EDT ----- Can we schedule this lady for Left ESWl for tomorrow? I am going to talk to the OR

## 2020-04-10 ENCOUNTER — Ambulatory Visit (HOSPITAL_COMMUNITY): Payer: Medicare Other

## 2020-04-10 ENCOUNTER — Encounter (HOSPITAL_COMMUNITY): Payer: Self-pay | Admitting: Urology

## 2020-04-10 ENCOUNTER — Ambulatory Visit (HOSPITAL_COMMUNITY)
Admission: RE | Admit: 2020-04-10 | Discharge: 2020-04-10 | Disposition: A | Discharge status: Home / Self Care | Payer: Medicare Other | Source: Ambulatory Visit | Attending: Urology | Admitting: Urology

## 2020-04-10 ENCOUNTER — Encounter (HOSPITAL_COMMUNITY)
Admission: RE | Disposition: A | Discharge status: Home / Self Care | Payer: Self-pay | Source: Ambulatory Visit | Attending: Urology

## 2020-04-10 DIAGNOSIS — N200 Calculus of kidney: Secondary | ICD-10-CM

## 2020-04-10 DIAGNOSIS — R8281 Pyuria: Secondary | ICD-10-CM | POA: Insufficient documentation

## 2020-04-10 DIAGNOSIS — N201 Calculus of ureter: Secondary | ICD-10-CM | POA: Diagnosis present

## 2020-04-10 HISTORY — PX: EXTRACORPOREAL SHOCK WAVE LITHOTRIPSY: SHX1557

## 2020-04-10 SURGERY — LITHOTRIPSY, ESWL
Anesthesia: LOCAL | Laterality: Left

## 2020-04-10 MED ORDER — HYDROCODONE-ACETAMINOPHEN 5-325 MG PO TABS
1.0000 | ORAL_TABLET | ORAL | 0 refills | Status: AC | PRN
Start: 2020-04-10 — End: ?

## 2020-04-10 MED ORDER — HYDROMORPHONE HCL 1 MG/ML IJ SOLN
INTRAMUSCULAR | Status: AC
  Filled 2020-04-10: qty 1

## 2020-04-10 MED ORDER — HYDROCODONE-ACETAMINOPHEN 5-325 MG PO TABS
1.0000 | ORAL_TABLET | Freq: Once | ORAL | Status: AC
  Administered 2020-04-10: 1 via ORAL

## 2020-04-10 MED ORDER — DIAZEPAM 5 MG PO TABS
10.0000 mg | ORAL_TABLET | Freq: Once | ORAL | Status: AC
  Administered 2020-04-10: 10 mg via ORAL
  Filled 2020-04-10: qty 2

## 2020-04-10 MED ORDER — HYDROMORPHONE HCL 1 MG/ML IJ SOLN
1.0000 mg | Freq: Once | INTRAMUSCULAR | Status: AC
  Administered 2020-04-10: 1 mg via INTRAVENOUS

## 2020-04-10 MED ORDER — SODIUM CHLORIDE 0.9 % IV SOLN
INTRAVENOUS | Status: DC
  Administered 2020-04-10: 1000 mL via INTRAVENOUS

## 2020-04-10 MED ORDER — ONDANSETRON HCL 4 MG PO TABS: 4.0000 mg | ORAL_TABLET | Freq: Every day | ORAL | 1 refills | Status: AC | PRN

## 2020-04-10 MED ORDER — DIPHENHYDRAMINE HCL 25 MG PO CAPS
25.0000 mg | ORAL_CAPSULE | ORAL | Status: AC
  Administered 2020-04-10: 25 mg via ORAL
  Filled 2020-04-10: qty 1

## 2020-04-10 MED ORDER — HYDROCODONE-ACETAMINOPHEN 5-325 MG PO TABS
ORAL_TABLET | ORAL | Status: AC
  Filled 2020-04-10: qty 1

## 2020-04-10 NOTE — Interval H&P Note (Signed)
History and Physical Interval Note:  04/10/2020 9:50 AM  Cheryl Wagner  has presented today for surgery, with the diagnosis of left ureteral calculus.  The various methods of treatment have been discussed with the patient and family. After consideration of risks, benefits and other options for treatment, the patient has consented to  Procedure(s): EXTRACORPOREAL SHOCK WAVE LITHOTRIPSY (ESWL) (Left) as a surgical intervention.  The patient's history has been reviewed, patient examined, no change in status, stable for surgery.  I have reviewed the patient's chart and labs.  Questions were answered to the patient's satisfaction.     Wilkie Aye

## 2020-04-10 NOTE — Progress Notes (Signed)
Patient in severe pain, Dr.Mckenzie called in OR and order for hydrocodone 5-325mg  given. Patient stating pain is making her nauseated and patient is shivering. Dr.Mckenzie present at bedside, dilaudid 1mg  IV once ordered.

## 2020-04-10 NOTE — Discharge Instructions (Signed)
Lithotripsy, Care After This sheet gives you information about how to care for yourself after your procedure. Your health care provider may also give you more specific instructions. If you have problems or questions, contact your health care provider. What can I expect after the procedure? After the procedure, it is common to have:  Some blood in your urine. This should only last for a few days.  Soreness in your back, sides, or upper abdomen for a few days.  Blotches or bruises on your back where the pressure wave entered the skin.  Pain, discomfort, or nausea when pieces (fragments) of the kidney stone move through the tube that carries urine from the kidney to the bladder (ureter). Stone fragments may pass soon after the procedure, but they may continue to pass for up to 4-8 weeks. ? If you have severe pain or nausea, contact your health care provider. This may be caused by a large stone that was not broken up, and this may mean that you need more treatment.  Some pain or discomfort during urination.  Some pain or discomfort in the lower abdomen or (in men) at the base of the penis. Follow these instructions at home: Medicines  Take over-the-counter and prescription medicines only as told by your health care provider.  If you were prescribed an antibiotic medicine, take it as told by your health care provider. Do not stop taking the antibiotic even if you start to feel better.  Do not drive for 24 hours if you were given a medicine to help you relax (sedative).  Do not drive or use heavy machinery while taking prescription pain medicine. Eating and drinking      Drink enough water and fluids to keep your urine clear or pale yellow. This helps any remaining pieces of the stone to pass. It can also help prevent new stones from forming.  Eat plenty of fresh fruits and vegetables.  Follow instructions from your health care provider about eating and drinking restrictions. You may be  instructed: ? To reduce how much salt (sodium) you eat or drink. Check ingredients and nutrition facts on packaged foods and beverages. ? To reduce how much meat you eat.  Eat the recommended amount of calcium for your age and gender. Ask your health care provider how much calcium you should have. General instructions  Get plenty of rest.  Most people can resume normal activities 1-2 days after the procedure. Ask your health care provider what activities are safe for you.  Your health care provider may direct you to lie in a certain position (postural drainage) and tap firmly (percuss) over your kidney area to help stone fragments pass. Follow instructions as told by your health care provider.  If directed, strain all urine through the strainer that was provided by your health care provider. ? Keep all fragments for your health care provider to see. Any stones that are found may be sent to a medical lab for examination. The stone may be as small as a grain of salt.  Keep all follow-up visits as told by your health care provider. This is important. Contact a health care provider if:  You have pain that is severe or does not get better with medicine.  You have nausea that is severe or does not go away.  You have blood in your urine longer than your health care provider told you to expect.  You have more blood in your urine.  You have pain during urination that does   not go away.  You urinate more frequently than usual and this does not go away.  You develop a rash or any other possible signs of an allergic reaction. Get help right away if:  You have severe pain in your back, sides, or upper abdomen.  You have severe pain while urinating.  Your urine is very dark red.  You have blood in your stool (feces).  You cannot pass any urine at all.  You feel a strong urge to urinate after emptying your bladder.  You have a fever or chills.  You develop shortness of breath,  difficulty breathing, or chest pain.  You have severe nausea that leads to persistent vomiting.  You faint. Summary  After this procedure, it is common to have some pain, discomfort, or nausea when pieces (fragments) of the kidney stone move through the tube that carries urine from the kidney to the bladder (ureter). If this pain or nausea is severe, however, you should contact your health care provider.  Most people can resume normal activities 1-2 days after the procedure. Ask your health care provider what activities are safe for you.  Drink enough water and fluids to keep your urine clear or pale yellow. This helps any remaining pieces of the stone to pass, and it can help prevent new stones from forming.  If directed, strain your urine and keep all fragments for your health care provider to see. Fragments or stones may be as small as a grain of salt.  Get help right away if you have severe pain in your back, sides, or upper abdomen or have severe pain while urinating. This information is not intended to replace advice given to you by your health care provider. Make sure you discuss any questions you have with your health care provider. Document Revised: 09/20/2018 Document Reviewed: 04/30/2016 Elsevier Patient Education  2020 [employer] Inc[employer]  

## 2020-04-10 NOTE — Progress Notes (Signed)
Patients pain tolerable at a 3, dr.mckenzie saw patient again and patient is ready for discharge.

## 2020-04-11 ENCOUNTER — Encounter (HOSPITAL_COMMUNITY): Payer: Self-pay | Admitting: Urology

## 2020-04-13 ENCOUNTER — Other Ambulatory Visit: Payer: Self-pay

## 2020-04-13 ENCOUNTER — Emergency Department (HOSPITAL_COMMUNITY)
Admission: EM | Admit: 2020-04-13 | Discharge: 2020-04-13 | Disposition: A | Discharge status: Home / Self Care | Payer: Medicare Other | Attending: Emergency Medicine | Admitting: Emergency Medicine

## 2020-04-13 ENCOUNTER — Encounter (HOSPITAL_COMMUNITY): Payer: Self-pay | Admitting: Emergency Medicine

## 2020-04-13 ENCOUNTER — Encounter: Payer: Self-pay | Admitting: Urology

## 2020-04-13 ENCOUNTER — Ambulatory Visit (INDEPENDENT_AMBULATORY_CARE_PROVIDER_SITE_OTHER): Payer: Medicare Other | Admitting: Urology

## 2020-04-13 VITALS — BP 113/70 | HR 78 | Temp 98.0°F | Ht 63.0 in | Wt 108.0 lb

## 2020-04-13 DIAGNOSIS — R109 Unspecified abdominal pain: Secondary | ICD-10-CM | POA: Insufficient documentation

## 2020-04-13 DIAGNOSIS — Z5321 Procedure and treatment not carried out due to patient leaving prior to being seen by health care provider: Secondary | ICD-10-CM | POA: Diagnosis not present

## 2020-04-13 DIAGNOSIS — N201 Calculus of ureter: Secondary | ICD-10-CM

## 2020-04-13 DIAGNOSIS — M545 Low back pain, unspecified: Secondary | ICD-10-CM | POA: Insufficient documentation

## 2020-04-13 MED ORDER — CIPROFLOXACIN HCL 500 MG PO TABS
500.0000 mg | ORAL_TABLET | Freq: Once | ORAL | Status: AC
  Administered 2020-04-13: 500 mg via ORAL

## 2020-04-13 NOTE — Progress Notes (Signed)
   04/13/20  CC: followup nephrolithiasis  HPI: Cheryl Wagner is a 74yo here for followup for nephrolithiasis. She is here for stent removal Blood pressure 113/70, pulse 78, temperature 98 F (36.7 C), height 5\' 3"  (1.6 m), weight 108 lb 0.5 oz (49 kg). NED. A&Ox3.   No respiratory distress   Abd soft, NT, ND Normal external genitalia with patent urethral meatus  Cystoscopy Procedure Note  Patient identification was confirmed, informed consent was obtained, and patient was prepped using Betadine solution.  Lidocaine jelly was administered per urethral meatus.    Procedure: - Flexible cystoscope introduced, without any difficulty.   - Thorough search of the bladder revealed:    normal urethral meatus    normal urothelium    no stones    no ulcers     no tumors    no urethral polyps    no trabeculation  - Ureteral orifices were normal in position and appearance.  Using a grasper the left ureteral stent was removed intact  Post-Procedure: - Patient tolerated the procedure well  Assessment/ Plan:    Return in about 2 weeks (around 04/27/2020) for KUB.  13/10/2019, MD

## 2020-04-13 NOTE — Progress Notes (Signed)
Urological Symptom Review ° °Patient is experiencing the following symptoms: °Frequent urination °Get up at night to urinate ° ° °Review of Systems ° °Gastrointestinal (upper)  : °Negative for upper GI symptoms ° °Gastrointestinal (lower) : °Negative for lower GI symptoms ° °Constitutional : °Negative for symptoms ° °Skin: °Negative for skin symptoms ° °Eyes: °Negative for eye symptoms ° °Ear/Nose/Throat : °Sinus problems ° °Hematologic/Lymphatic: °Negative for Hematologic/Lymphatic symptoms ° °Cardiovascular : °Negative for cardiovascular symptoms ° °Respiratory : °Negative for respiratory symptoms ° °Endocrine: °Negative for endocrine symptoms ° °Musculoskeletal: °Negative for musculoskeletal symptoms ° °Neurological: °Negative for neurological symptoms ° °Psychologic: °Negative for psychiatric symptoms °

## 2020-04-13 NOTE — ED Triage Notes (Signed)
Pt c/o  Left back/flank pain since having a stent removed today by urology.

## 2020-04-16 LAB — MICROSCOPIC EXAMINATION: Renal Epithel, UA: NONE SEEN /hpf

## 2020-04-16 LAB — URINALYSIS, ROUTINE W REFLEX MICROSCOPIC
Bilirubin, UA: NEGATIVE
Glucose, UA: NEGATIVE
Ketones, UA: NEGATIVE
Nitrite, UA: NEGATIVE
Specific Gravity, UA: 1.02 (ref 1.005–1.030)
Urobilinogen, Ur: 0.2 mg/dL (ref 0.2–1.0)
pH, UA: 5 (ref 5.0–7.5)

## 2020-04-18 ENCOUNTER — Telehealth: Payer: Self-pay

## 2020-04-18 NOTE — Telephone Encounter (Signed)
Pt called c/o severe, sharp intermittent pains still in her left side . Pt had Lithotripsy last week. Wondered if she had possibly had another stone. I talked with Dr. Ronne Binning. He said there wouldn't be another stone. He said to give it more time, continue Flomax and pain med as needed.Pt notified and voiced understanding.

## 2020-04-18 NOTE — Patient Instructions (Signed)

## 2020-04-20 NOTE — Anesthesia Postprocedure Evaluation (Signed)
Anesthesia Post Note  Patient: Cheryl Wagner  Procedure(s) Performed: CYSTOSCOPY LEFT RETROGRADE  STENT PLACEMENT (Left Ureter)     Patient location during evaluation: PACU Anesthesia Type: General Level of consciousness: awake Pain management: pain level controlled Vital Signs Assessment: post-procedure vital signs reviewed and stable Respiratory status: spontaneous breathing Cardiovascular status: stable Postop Assessment: no apparent nausea or vomiting Anesthetic complications: no   No complications documented.  Last Vitals:  Vitals:   04/06/20 2232 04/07/20 0155  BP: 111/65 127/71  Pulse: 73 76  Resp: 16 16  Temp: 36.9 C 37.2 C  SpO2: 97% 98%    Last Pain:  Vitals:   04/07/20 0849  TempSrc:   PainSc: 0-No pain   Pain Goal:                   Caren Macadam

## 2020-04-26 ENCOUNTER — Ambulatory Visit (HOSPITAL_COMMUNITY)
Admission: RE | Admit: 2020-04-26 | Discharge: 2020-04-26 | Disposition: A | Discharge status: Home / Self Care | Payer: Medicare Other | Source: Ambulatory Visit | Attending: Urology | Admitting: Urology

## 2020-04-26 ENCOUNTER — Other Ambulatory Visit: Payer: Self-pay

## 2020-04-26 DIAGNOSIS — N201 Calculus of ureter: Secondary | ICD-10-CM | POA: Diagnosis not present

## 2020-04-27 ENCOUNTER — Encounter: Payer: Self-pay | Admitting: Urology

## 2020-04-27 ENCOUNTER — Ambulatory Visit (INDEPENDENT_AMBULATORY_CARE_PROVIDER_SITE_OTHER): Payer: Medicare Other | Admitting: Urology

## 2020-04-27 VITALS — BP 116/63 | HR 83 | Temp 98.0°F | Ht 63.0 in | Wt 114.0 lb

## 2020-04-27 DIAGNOSIS — N201 Calculus of ureter: Secondary | ICD-10-CM

## 2020-04-27 LAB — URINALYSIS, ROUTINE W REFLEX MICROSCOPIC
Bilirubin, UA: NEGATIVE
Glucose, UA: NEGATIVE
Ketones, UA: NEGATIVE
Leukocytes,UA: NEGATIVE
Nitrite, UA: NEGATIVE
Protein,UA: NEGATIVE
RBC, UA: NEGATIVE
Specific Gravity, UA: 1.01 (ref 1.005–1.030)
Urobilinogen, Ur: 0.2 mg/dL (ref 0.2–1.0)
pH, UA: 5.5 (ref 5.0–7.5)

## 2020-04-27 NOTE — Patient Instructions (Signed)
Dietary Guidelines to Help Prevent Kidney Stones Kidney stones are deposits of minerals and salts that form inside your kidneys. Your risk of developing kidney stones may be greater depending on your diet, your lifestyle, the medicines you take, and whether you have certain medical conditions. Most people can reduce their chances of developing kidney stones by following the instructions below. Depending on your overall health and the type of kidney stones you tend to develop, your dietitian may give you more specific instructions. What are tips for following this plan? Reading food labels  Choose foods with "no salt added" or "low-salt" labels. Limit your sodium intake to less than 1500 mg per day.  Choose foods with calcium for each meal and snack. Try to eat about 300 mg of calcium at each meal. Foods that contain 200-500 mg of calcium per serving include: ? 8 oz (237 ml) of milk, fortified nondairy milk, and fortified fruit juice. ? 8 oz (237 ml) of kefir, yogurt, and soy yogurt. ? 4 oz (118 ml) of tofu. ? 1 oz of cheese. ? 1 cup (300 g) of dried figs. ? 1 cup (91 g) of cooked broccoli. ? 1-3 oz can of sardines or mackerel.  Most people need 1000 to 1500 mg of calcium each day. Talk to your dietitian about how much calcium is recommended for you. Shopping  Buy plenty of fresh fruits and vegetables. Most people do not need to avoid fruits and vegetables, even if they contain nutrients that may contribute to kidney stones.  When shopping for convenience foods, choose: ? Whole pieces of fruit. ? Premade salads with dressing on the side. ? Low-fat fruit and yogurt smoothies.  Avoid buying frozen meals or prepared deli foods.  Look for foods with live cultures, such as yogurt and kefir. Cooking  Do not add salt to food when cooking. Place a salt shaker on the table and allow each person to add his or her own salt to taste.  Use vegetable protein, such as beans, textured vegetable  protein (TVP), or tofu instead of meat in pasta, casseroles, and soups. Meal planning   Eat less salt, if told by your dietitian. To do this: ? Avoid eating processed or premade food. ? Avoid eating fast food.  Eat less animal protein, including cheese, meat, poultry, or fish, if told by your dietitian. To do this: ? Limit the number of times you have meat, poultry, fish, or cheese each week. Eat a diet free of meat at least 2 days a week. ? Eat only one serving each day of meat, poultry, fish, or seafood. ? When you prepare animal protein, cut pieces into small portion sizes. For most meat and fish, one serving is about the size of one deck of cards.  Eat at least 5 servings of fresh fruits and vegetables each day. To do this: ? Keep fruits and vegetables on hand for snacks. ? Eat 1 piece of fruit or a handful of berries with breakfast. ? Have a salad and fruit at lunch. ? Have two kinds of vegetables at dinner.  Limit foods that are high in a substance called oxalate. These include: ? Spinach. ? Rhubarb. ? Beets. ? Potato chips and french fries. ? Nuts.  If you regularly take a diuretic medicine, make sure to eat at least 1-2 fruits or vegetables high in potassium each day. These include: ? Avocado. ? Banana. ? Orange, prune, carrot, or tomato juice. ? Baked potato. ? Cabbage. ? Beans and split   peas. General instructions   Drink enough fluid to keep your urine clear or pale yellow. This is the most important thing you can do.  Talk to your health care provider and dietitian about taking daily supplements. Depending on your health and the cause of your kidney stones, you may be advised: ? Not to take supplements with vitamin C. ? To take a calcium supplement. ? To take a daily probiotic supplement. ? To take other supplements such as magnesium, fish oil, or vitamin B6.  Take all medicines and supplements as told by your health care provider.  Limit alcohol intake to no  more than 1 drink a day for nonpregnant women and 2 drinks a day for men. One drink equals 12 oz of beer, 5 oz of wine, or 1 oz of hard liquor.  Lose weight if told by your health care provider. Work with your dietitian to find strategies and an eating plan that works best for you. What foods are not recommended? Limit your intake of the following foods, or as told by your dietitian. Talk to your dietitian about specific foods you should avoid based on the type of kidney stones and your overall health. Grains Breads. Bagels. Rolls. Baked goods. Salted crackers. Cereal. Pasta. Vegetables Spinach. Rhubarb. Beets. Canned vegetables. Pickles. Olives. Meats and other protein foods Nuts. Nut butters. Large portions of meat, poultry, or fish. Salted or cured meats. Deli meats. Hot dogs. Sausages. Dairy Cheese. Beverages Regular soft drinks. Regular vegetable juice. Seasonings and other foods Seasoning blends with salt. Salad dressings. Canned soups. Soy sauce. Ketchup. Barbecue sauce. Canned pasta sauce. Casseroles. Pizza. Lasagna. Frozen meals. Potato chips. French fries. Summary  You can reduce your risk of kidney stones by making changes to your diet.  The most important thing you can do is drink enough fluid. You should drink enough fluid to keep your urine clear or pale yellow.  Ask your health care provider or dietitian how much protein from animal sources you should eat each day, and also how much salt and calcium you should have each day. This information is not intended to replace advice given to you by your health care provider. Make sure you discuss any questions you have with your health care provider. Document Revised: 09/29/2018 Document Reviewed: 05/20/2016 Elsevier Patient Education  2020 Elsevier Inc.  

## 2020-04-27 NOTE — Progress Notes (Signed)
04/27/2020 10:41 AM   Cheryl Wagner 31-Oct-1945 [id number]  Referring provider: Ignatius Specking, MD [address] [place],  [place] [address]  followup nephrolithiasis  HPI: Cheryl Wagner is a 74yo here for followup for nephrolithiasis. She had her stent removed 2 weeks ago. She passed multiple fragments. KUB shows 70mm residual left renal calculus   PMH: Past Medical History:  Diagnosis Date  . History of kidney stones     Surgical History: Past Surgical History:  Procedure Laterality Date  . ABDOMINAL HYSTERECTOMY    . CYSTOSCOPY WITH STENT PLACEMENT Left 04/06/2020   Procedure: CYSTOSCOPY LEFT RETROGRADE  STENT PLACEMENT;  Surgeon: Malen Gauze, MD;  Location: [facility];  Service: Urology;  Laterality: Left;  . CYSTOSCOPY/URETEROSCOPY/HOLMIUM LASER/STENT PLACEMENT    . EXTRACORPOREAL SHOCK WAVE LITHOTRIPSY Left 04/10/2020   Procedure: EXTRACORPOREAL SHOCK WAVE LITHOTRIPSY (ESWL);  Surgeon: Malen Gauze, MD;  Location: [facility];  Service: Urology;  Laterality: Left;    Home Medications:  Allergies as of 04/27/2020      Reactions   Codeine Nausea Only      Medication List       Accurate as of April 27, 2020 10:41 AM. If you have any questions, ask your nurse or doctor.        cefpodoxime 200 MG tablet Commonly known as: VANTIN Take 1 tablet (200 mg total) by mouth 2 (two) times daily.   fluticasone 50 MCG/ACT nasal spray Commonly known as: FLONASE Place 1-2 sprays into both nostrils daily as needed for allergies or rhinitis.   HYDROcodone-acetaminophen 5-325 MG tablet Commonly known as: NORCO/VICODIN Take 1 tablet by mouth every 4 (four) hours as needed for moderate pain.   ibuprofen 200 MG tablet Commonly known as: ADVIL Take 200 mg by mouth every 6 (six) hours as needed for fever, headache or mild pain.   KRILL OIL PO Take 1 tablet by mouth daily.   metroNIDAZOLE 500 MG tablet Commonly known as: FLAGYL Take 500 mg by mouth 2 (two) times daily. 7 day  supply   multivitamin with minerals Tabs tablet Take 1 tablet by mouth daily.   naproxen sodium 220 MG tablet Commonly known as: ALEVE Take 440 mg by mouth 2 (two) times daily as needed (headache/pain).   ondansetron 4 MG tablet Commonly known as: Zofran Take 1 tablet (4 mg total) by mouth daily as needed for nausea or vomiting.   tamsulosin 0.4 MG Caps capsule Commonly known as: Flomax Take 1 capsule (0.4 mg total) by mouth daily after supper.   VITAMIN D PO Take 1 tablet by mouth daily.       Allergies:  Allergies  Allergen Reactions  . Codeine Nausea Only    Family History: No family history on file.  Social History:  reports that she has never smoked. She has never used smokeless tobacco. She reports previous alcohol use. She reports that she does not use drugs.  ROS: All other review of systems were reviewed and are negative except what is noted above in HPI  Physical Exam: BP 116/63   Pulse 83   Temp 98 F (36.7 C)   Ht 5\' 3"  (1.6 m)   Wt 114 lb (51.7 kg)   BMI 20.19 kg/m   Constitutional:  Alert and oriented, No acute distress. HEENT: [place] AT, moist mucus membranes.  Trachea midline, no masses. Cardiovascular: No clubbing, cyanosis, or edema. Respiratory: Normal respiratory effort, no increased work of breathing. GI: Abdomen is soft, nontender, nondistended, no  abdominal masses GU: No CVA tenderness.  Lymph: No cervical or inguinal lymphadenopathy. Skin: No rashes, bruises or suspicious lesions. Neurologic: Grossly intact, no focal deficits, moving all 4 extremities. Psychiatric: Normal mood and affect.  Laboratory Data: Lab Results  Component Value Date   WBC 8.1 04/07/2020   HGB 11.6 (L) 04/07/2020   HCT 34.9 (L) 04/07/2020   MCV 91.4 04/07/2020   PLT 258 04/07/2020    Lab Results  Component Value Date   CREATININE 0.65 04/07/2020    No results found for: PSA  No results found for: TESTOSTERONE  No results found for:  HGBA1C  Urinalysis    Component Value Date/Time   COLORURINE YELLOW 04/06/2020 0840   APPEARANCEUR Clear 04/27/2020 0958   LABSPEC 1.020 04/06/2020 0840   PHURINE 5.0 04/06/2020 0840   GLUCOSEU Negative 04/27/2020 0958   HGBUR LARGE (A) 04/06/2020 0840   BILIRUBINUR Negative 04/27/2020 0958   KETONESUR NEGATIVE 04/06/2020 0840   PROTEINUR Negative 04/27/2020 0958   PROTEINUR NEGATIVE 04/06/2020 0840   NITRITE Negative 04/27/2020 0958   NITRITE NEGATIVE 04/06/2020 0840   LEUKOCYTESUR Negative 04/27/2020 0958   LEUKOCYTESUR MODERATE (A) 04/06/2020 0840    Lab Results  Component Value Date   LABMICR Comment 04/27/2020   WBCUA 0-5 04/16/2020   LABEPIT 0-10 04/16/2020   BACTERIA Many (A) 04/16/2020    Pertinent Imaging: KUB yesterday: Images reviewed and discussed with the patient Results for orders placed during the hospital encounter of 04/10/20  DG Abd 1 View  Narrative CLINICAL DATA:  Left kidney stone, stent placed 04/06/2020  EXAM: ABDOMEN - 1 VIEW  COMPARISON:  Correlation made with CT from 04/05/2020  FINDINGS: There is a left double-J nephroureteral stent. Left ureteral calculus on CT is not definitely identified. Normal bowel gas pattern.  IMPRESSION: Left double-J nephroureteral stent. Left ureteral calculus on prior CT not definitely identified.   Electronically Signed By: Guadlupe Spanish M.D. On: 04/10/2020 09:19  No results found for this or any previous visit.  No results found for this or any previous visit.  No results found for this or any previous visit.  No results found for this or any previous visit.  No results found for this or any previous visit.  No results found for this or any previous visit.  No results found for this or any previous visit.   Assessment & Plan:    1. Ureteral calculus -Continue medical expulsive therapy. RTC 1 month with KUB - Urinalysis, Routine w reflex microscopic   No follow-ups on  file.  Wilkie Aye, MD  [facility]

## 2020-04-27 NOTE — Progress Notes (Signed)
Urological Symptom Review  Patient is experiencing the following symptoms: Get up at night to urinate  Kidney stones   Review of Systems  Gastrointestinal (upper)  : Negative for upper GI symptoms  Gastrointestinal (lower) : Negative for lower GI symptoms  Constitutional : Fatigue  Skin: Negative for skin symptoms  Eyes: Negative for eye symptoms  Ear/Nose/Throat : Sinus problems  Hematologic/Lymphatic: Negative for Hematologic/Lymphatic symptoms  Cardiovascular : Negative for cardiovascular symptoms  Respiratory : Negative for respiratory symptoms  Endocrine: Negative for endocrine symptoms  Musculoskeletal: Back pain  Neurological: Negative for neurological symptoms  Psychologic: Negative for psychiatric symptoms

## 2020-05-29 ENCOUNTER — Other Ambulatory Visit: Payer: Self-pay

## 2020-05-29 ENCOUNTER — Ambulatory Visit (HOSPITAL_COMMUNITY)
Admission: RE | Admit: 2020-05-29 | Discharge: 2020-05-29 | Disposition: A | Discharge status: Home / Self Care | Payer: Medicare Other | Source: Ambulatory Visit | Attending: Urology | Admitting: Urology

## 2020-05-29 DIAGNOSIS — N201 Calculus of ureter: Secondary | ICD-10-CM | POA: Diagnosis not present

## 2020-05-30 ENCOUNTER — Ambulatory Visit (INDEPENDENT_AMBULATORY_CARE_PROVIDER_SITE_OTHER): Payer: Medicare Other | Admitting: Urology

## 2020-05-30 ENCOUNTER — Encounter: Payer: Self-pay | Admitting: Urology

## 2020-05-30 VITALS — BP 118/72 | HR 81 | Temp 97.9°F | Wt 112.0 lb

## 2020-05-30 DIAGNOSIS — N201 Calculus of ureter: Secondary | ICD-10-CM | POA: Diagnosis not present

## 2020-05-30 LAB — URINALYSIS, ROUTINE W REFLEX MICROSCOPIC
Bilirubin, UA: NEGATIVE
Glucose, UA: NEGATIVE
Ketones, UA: NEGATIVE
Leukocytes,UA: NEGATIVE
Nitrite, UA: NEGATIVE
Protein,UA: NEGATIVE
RBC, UA: NEGATIVE
Specific Gravity, UA: 1.01 (ref 1.005–1.030)
Urobilinogen, Ur: 0.2 mg/dL (ref 0.2–1.0)
pH, UA: 5 (ref 5.0–7.5)

## 2020-05-30 NOTE — Patient Instructions (Signed)
Dietary Guidelines to Help Prevent Kidney Stones Kidney stones are deposits of minerals and salts that form inside your kidneys. Your risk of developing kidney stones may be greater depending on your diet, your lifestyle, the medicines you take, and whether you have certain medical conditions. Most people can reduce their chances of developing kidney stones by following the instructions below. Depending on your overall health and the type of kidney stones you tend to develop, your dietitian may give you more specific instructions. What are tips for following this plan? Reading food labels  Choose foods with "no salt added" or "low-salt" labels. Limit your sodium intake to less than 1500 mg per day.  Choose foods with calcium for each meal and snack. Try to eat about 300 mg of calcium at each meal. Foods that contain 200-500 mg of calcium per serving include: ? 8 oz (237 ml) of milk, fortified nondairy milk, and fortified fruit juice. ? 8 oz (237 ml) of kefir, yogurt, and soy yogurt. ? 4 oz (118 ml) of tofu. ? 1 oz of cheese. ? 1 cup (300 g) of dried figs. ? 1 cup (91 g) of cooked broccoli. ? 1-3 oz can of sardines or mackerel.  Most people need 1000 to 1500 mg of calcium each day. Talk to your dietitian about how much calcium is recommended for you. Shopping  Buy plenty of fresh fruits and vegetables. Most people do not need to avoid fruits and vegetables, even if they contain nutrients that may contribute to kidney stones.  When shopping for convenience foods, choose: ? Whole pieces of fruit. ? Premade salads with dressing on the side. ? Low-fat fruit and yogurt smoothies.  Avoid buying frozen meals or prepared deli foods.  Look for foods with live cultures, such as yogurt and kefir. Cooking  Do not add salt to food when cooking. Place a salt shaker on the table and allow each person to add his or her own salt to taste.  Use vegetable protein, such as beans, textured vegetable  protein (TVP), or tofu instead of meat in pasta, casseroles, and soups. Meal planning   Eat less salt, if told by your dietitian. To do this: ? Avoid eating processed or premade food. ? Avoid eating fast food.  Eat less animal protein, including cheese, meat, poultry, or fish, if told by your dietitian. To do this: ? Limit the number of times you have meat, poultry, fish, or cheese each week. Eat a diet free of meat at least 2 days a week. ? Eat only one serving each day of meat, poultry, fish, or seafood. ? When you prepare animal protein, cut pieces into small portion sizes. For most meat and fish, one serving is about the size of one deck of cards.  Eat at least 5 servings of fresh fruits and vegetables each day. To do this: ? Keep fruits and vegetables on hand for snacks. ? Eat 1 piece of fruit or a handful of berries with breakfast. ? Have a salad and fruit at lunch. ? Have two kinds of vegetables at dinner.  Limit foods that are high in a substance called oxalate. These include: ? Spinach. ? Rhubarb. ? Beets. ? Potato chips and french fries. ? Nuts.  If you regularly take a diuretic medicine, make sure to eat at least 1-2 fruits or vegetables high in potassium each day. These include: ? Avocado. ? Banana. ? Orange, prune, carrot, or tomato juice. ? Baked potato. ? Cabbage. ? Beans and split   peas. General instructions   Drink enough fluid to keep your urine clear or pale yellow. This is the most important thing you can do.  Talk to your health care provider and dietitian about taking daily supplements. Depending on your health and the cause of your kidney stones, you may be advised: ? Not to take supplements with vitamin C. ? To take a calcium supplement. ? To take a daily probiotic supplement. ? To take other supplements such as magnesium, fish oil, or vitamin B6.  Take all medicines and supplements as told by your health care provider.  Limit alcohol intake to no  more than 1 drink a day for nonpregnant women and 2 drinks a day for men. One drink equals 12 oz of beer, 5 oz of wine, or 1 oz of hard liquor.  Lose weight if told by your health care provider. Work with your dietitian to find strategies and an eating plan that works best for you. What foods are not recommended? Limit your intake of the following foods, or as told by your dietitian. Talk to your dietitian about specific foods you should avoid based on the type of kidney stones and your overall health. Grains Breads. Bagels. Rolls. Baked goods. Salted crackers. Cereal. Pasta. Vegetables Spinach. Rhubarb. Beets. Canned vegetables. Pickles. Olives. Meats and other protein foods Nuts. Nut butters. Large portions of meat, poultry, or fish. Salted or cured meats. Deli meats. Hot dogs. Sausages. Dairy Cheese. Beverages Regular soft drinks. Regular vegetable juice. Seasonings and other foods Seasoning blends with salt. Salad dressings. Canned soups. Soy sauce. Ketchup. Barbecue sauce. Canned pasta sauce. Casseroles. Pizza. Lasagna. Frozen meals. Potato chips. French fries. Summary  You can reduce your risk of kidney stones by making changes to your diet.  The most important thing you can do is drink enough fluid. You should drink enough fluid to keep your urine clear or pale yellow.  Ask your health care provider or dietitian how much protein from animal sources you should eat each day, and also how much salt and calcium you should have each day. This information is not intended to replace advice given to you by your health care provider. Make sure you discuss any questions you have with your health care provider. Document Revised: 09/29/2018 Document Reviewed: 05/20/2016 Elsevier Patient Education  2020 Elsevier Inc.  

## 2020-05-30 NOTE — Progress Notes (Signed)
Urological Symptom Review  Patient is experiencing the following symptoms: Get up at night to urinate   Review of Systems  Gastrointestinal (upper)  : Negative for upper GI symptoms  Gastrointestinal (lower) : Negative for lower GI symptoms  Constitutional : Negative for symptoms  Skin: Negative for skin symptoms  Eyes: Negative for eye symptoms  Ear/Nose/Throat : Sinus problems  Hematologic/Lymphatic: Negative for Hematologic/Lymphatic symptoms  Cardiovascular : Negative for cardiovascular symptoms  Respiratory : Negative for respiratory symptoms  Endocrine: Negative for endocrine symptoms  Musculoskeletal: Back pain  Neurological: Negative for neurological symptoms  Psychologic: Negative for psychiatric symptoms

## 2020-05-30 NOTE — Progress Notes (Signed)
05/30/2020 1:45 PM   Cheryl Wagner [birth year] [id number]  Referring provider: Ignatius Specking, MD [address] [place],  [place] [address]  nephrolithiasis  HPI: Cheryl Wagner is a 74yo her for followup for nephrolithiasis. No flank pain. No stone evetns since last visit. KUb from yesterday shows no calculi.    PMH: Past Medical History:  Diagnosis Date  . History of kidney stones     Surgical History: Past Surgical History:  Procedure Laterality Date  . ABDOMINAL HYSTERECTOMY    . CYSTOSCOPY WITH STENT PLACEMENT Left 04/06/2020   Procedure: CYSTOSCOPY LEFT RETROGRADE  STENT PLACEMENT;  Surgeon: Malen Gauze, MD;  Location: [facility];  Service: Urology;  Laterality: Left;  . CYSTOSCOPY/URETEROSCOPY/HOLMIUM LASER/STENT PLACEMENT    . EXTRACORPOREAL SHOCK WAVE LITHOTRIPSY Left 04/10/2020   Procedure: EXTRACORPOREAL SHOCK WAVE LITHOTRIPSY (ESWL);  Surgeon: Malen Gauze, MD;  Location: [facility];  Service: Urology;  Laterality: Left;    Home Medications:  Allergies as of 05/30/2020      Reactions   Codeine Nausea Only      Medication List       Accurate as of May 30, 2020  1:45 PM. If you have any questions, ask your nurse or doctor.        cefpodoxime 200 MG tablet Commonly known as: VANTIN Take 1 tablet (200 mg total) by mouth 2 (two) times daily.   fluticasone 50 MCG/ACT nasal spray Commonly known as: FLONASE Place 1-2 sprays into both nostrils daily as needed for allergies or rhinitis.   HYDROcodone-acetaminophen 5-325 MG tablet Commonly known as: NORCO/VICODIN Take 1 tablet by mouth every 4 (four) hours as needed for moderate pain.   ibuprofen 200 MG tablet Commonly known as: ADVIL Take 200 mg by mouth every 6 (six) hours as needed for fever, headache or mild pain.   KRILL OIL PO Take 1 tablet by mouth daily.   metroNIDAZOLE 500 MG tablet Commonly known as: FLAGYL Take 500 mg by mouth 2 (two) times daily. 7 day supply   multivitamin with minerals  Tabs tablet Take 1 tablet by mouth daily.   naproxen sodium 220 MG tablet Commonly known as: ALEVE Take 440 mg by mouth 2 (two) times daily as needed (headache/pain).   ondansetron 4 MG tablet Commonly known as: Zofran Take 1 tablet (4 mg total) by mouth daily as needed for nausea or vomiting.   tamsulosin 0.4 MG Caps capsule Commonly known as: Flomax Take 1 capsule (0.4 mg total) by mouth daily after supper.   VITAMIN D PO Take 1 tablet by mouth daily.       Allergies:  Allergies  Allergen Reactions  . Codeine Nausea Only    Family History: No family history on file.  Social History:  reports that she has never smoked. She has never used smokeless tobacco. She reports previous alcohol use. She reports that she does not use drugs.  ROS: All other review of systems were reviewed and are negative except what is noted above in HPI  Physical Exam: BP 118/72   Pulse 81   Temp 97.9 F (36.6 C)   Wt 112 lb (50.8 kg)   BMI 19.84 kg/m   Constitutional:  Alert and oriented, No acute distress. HEENT: [place] AT, moist mucus membranes.  Trachea midline, no masses. Cardiovascular: No clubbing, cyanosis, or edema. Respiratory: Normal respiratory effort, no increased work of breathing. GI: Abdomen is soft, nontender, nondistended, no abdominal masses GU: No CVA tenderness.  Lymph: No cervical  or inguinal lymphadenopathy. Skin: No rashes, bruises or suspicious lesions. Neurologic: Grossly intact, no focal deficits, moving all 4 extremities. Psychiatric: Normal mood and affect.  Laboratory Data: Lab Results  Component Value Date   WBC 8.1 04/07/2020   HGB 11.6 (L) 04/07/2020   HCT 34.9 (L) 04/07/2020   MCV 91.4 04/07/2020   PLT 258 04/07/2020    Lab Results  Component Value Date   CREATININE 0.65 04/07/2020    No results found for: PSA  No results found for: TESTOSTERONE  No results found for: HGBA1C  Urinalysis    Component Value Date/Time   COLORURINE YELLOW  04/06/2020 0840   APPEARANCEUR Clear 04/27/2020 0958   LABSPEC 1.020 04/06/2020 0840   PHURINE 5.0 04/06/2020 0840   GLUCOSEU Negative 04/27/2020 0958   HGBUR LARGE (A) 04/06/2020 0840   BILIRUBINUR Negative 04/27/2020 0958   KETONESUR NEGATIVE 04/06/2020 0840   PROTEINUR Negative 04/27/2020 0958   PROTEINUR NEGATIVE 04/06/2020 0840   NITRITE Negative 04/27/2020 0958   NITRITE NEGATIVE 04/06/2020 0840   LEUKOCYTESUR Negative 04/27/2020 0958   LEUKOCYTESUR MODERATE (A) 04/06/2020 0840    Lab Results  Component Value Date   LABMICR Comment 04/27/2020   WBCUA 0-5 04/16/2020   LABEPIT 0-10 04/16/2020   BACTERIA Many (A) 04/16/2020    Pertinent Imaging: KUB: images reviewed and discussed with the surgery Results for orders placed during the hospital encounter of 05/29/20  Abdomen 1 view (KUB)  Narrative CLINICAL DATA:  Nephrolithiasis. Left ureteral calculus with back pain.  EXAM: ABDOMEN - 1 VIEW  COMPARISON:  Most recent radiograph 04/26/2020. Most recent CT 04/05/2020  FINDINGS: No visualized radiopaque calculi project over the renal beds, course of the ureters or in the pelvis. Normal bowel gas pattern with small volume of colonic stool. No obstruction. No evidence of free air on the supine view. The lung bases are clear. Mild degenerative change of both hips. Osteitis pubis partially included.  IMPRESSION: No visualized radiopaque calculi.   Electronically Signed By: Narda Rutherford M.D. On: 05/30/2020 09:28  No results found for this or any previous visit.  No results found for this or any previous visit.  No results found for this or any previous visit.  No results found for this or any previous visit.  No results found for this or any previous visit.  No results found for this or any previous visit.  No results found for this or any previous visit.   Assessment & Plan:    1. Ureteral calculus RTC 6 months with KUB - Urinalysis, Routine w  reflex microscopic   No follow-ups on file.  Wilkie Aye, MD  [facility] [place]

## 2020-11-28 ENCOUNTER — Other Ambulatory Visit: Payer: Self-pay

## 2020-11-28 ENCOUNTER — Encounter: Payer: Self-pay | Admitting: Urology

## 2020-11-28 ENCOUNTER — Ambulatory Visit (INDEPENDENT_AMBULATORY_CARE_PROVIDER_SITE_OTHER): Payer: Medicare Other | Admitting: Urology

## 2020-11-28 ENCOUNTER — Ambulatory Visit (HOSPITAL_COMMUNITY)
Admission: RE | Admit: 2020-11-28 | Discharge: 2020-11-28 | Disposition: A | Discharge status: Home / Self Care | Payer: Medicare Other | Source: Ambulatory Visit | Attending: Urology | Admitting: Urology

## 2020-11-28 VITALS — BP 118/69 | HR 86 | Ht 63.0 in | Wt 110.0 lb

## 2020-11-28 DIAGNOSIS — N201 Calculus of ureter: Secondary | ICD-10-CM

## 2020-11-28 LAB — URINALYSIS, ROUTINE W REFLEX MICROSCOPIC
Bilirubin, UA: NEGATIVE
Glucose, UA: NEGATIVE
Ketones, UA: NEGATIVE
Leukocytes,UA: NEGATIVE
Nitrite, UA: NEGATIVE
Protein,UA: NEGATIVE
RBC, UA: NEGATIVE
Specific Gravity, UA: 1.025 (ref 1.005–1.030)
Urobilinogen, Ur: 0.2 mg/dL (ref 0.2–1.0)
pH, UA: 5.5 (ref 5.0–7.5)

## 2020-11-28 NOTE — Progress Notes (Signed)
Urological Symptom Review  Patient is experiencing the following symptoms: Get up at night to urinate  Kidney stones   Review of Systems  Gastrointestinal (upper)  : Nausea  Gastrointestinal (lower) : Negative for lower GI symptoms  Constitutional : Negative for symptoms  Skin: Negative for skin symptoms  Eyes: Negative for eye symptoms  Ear/Nose/Throat : Negative for Ear/Nose/Throat symptoms  Hematologic/Lymphatic: Negative for Hematologic/Lymphatic symptoms  Cardiovascular : Negative for cardiovascular symptoms  Respiratory : Negative for respiratory symptoms  Endocrine: Negative for endocrine symptoms  Musculoskeletal: Back pain  Neurological: Negative for neurological symptoms  Psychologic: Negative for psychiatric symptoms

## 2020-11-28 NOTE — Patient Instructions (Signed)

## 2020-11-28 NOTE — Progress Notes (Signed)
11/28/2020 2:10 PM   JANELE LAGUE 04/03/1946 [id number]  Referring provider: Ignatius Specking, MD [address] [place],  [place] [address]  Followup nephrolithiasis   HPI: Ms Enyeart is a 75yo here for followup for nephrolithiasis. No stone events since last visit. No flank pain. KUB from today shows no calculi. No LUTS. No toher complaints today   PMH: Past Medical History:  Diagnosis Date   History of kidney stones     Surgical History: Past Surgical History:  Procedure Laterality Date   ABDOMINAL HYSTERECTOMY     CYSTOSCOPY WITH STENT PLACEMENT Left 04/06/2020   Procedure: CYSTOSCOPY LEFT RETROGRADE  STENT PLACEMENT;  Surgeon: Malen Gauze, MD;  Location: [facility];  Service: Urology;  Laterality: Left;   CYSTOSCOPY/URETEROSCOPY/HOLMIUM LASER/STENT PLACEMENT     EXTRACORPOREAL SHOCK WAVE LITHOTRIPSY Left 04/10/2020   Procedure: EXTRACORPOREAL SHOCK WAVE LITHOTRIPSY (ESWL);  Surgeon: Malen Gauze, MD;  Location: [facility];  Service: Urology;  Laterality: Left;    Home Medications:  Allergies as of 11/28/2020       Reactions   Codeine Nausea Only        Medication List        Accurate as of November 28, 2020  2:10 PM. If you have any questions, ask your nurse or doctor.          STOP taking these medications    metroNIDAZOLE 500 MG tablet Commonly known as: FLAGYL Stopped by: Wilkie Aye, MD   tamsulosin 0.4 MG Caps capsule Commonly known as: Flomax Stopped by: Wilkie Aye, MD       TAKE these medications    alendronate 70 MG tablet Commonly known as: FOSAMAX Take 70 mg by mouth once a week.   cefpodoxime 200 MG tablet Commonly known as: VANTIN Take 1 tablet (200 mg total) by mouth 2 (two) times daily.   fluticasone 50 MCG/ACT nasal spray Commonly known as: FLONASE Place 1-2 sprays into both nostrils daily as needed for allergies or rhinitis.   HYDROcodone-acetaminophen 5-325 MG tablet Commonly known as: NORCO/VICODIN Take 1 tablet  by mouth every 4 (four) hours as needed for moderate pain.   ibuprofen 200 MG tablet Commonly known as: ADVIL Take 200 mg by mouth every 6 (six) hours as needed for fever, headache or mild pain.   KRILL OIL PO Take 1 tablet by mouth daily.   multivitamin with minerals Tabs tablet Take 1 tablet by mouth daily.   naproxen sodium 220 MG tablet Commonly known as: ALEVE Take 440 mg by mouth 2 (two) times daily as needed (headache/pain).   ondansetron 4 MG tablet Commonly known as: Zofran Take 1 tablet (4 mg total) by mouth daily as needed for nausea or vomiting.   VITAMIN D PO Take 1 tablet by mouth daily.        Allergies:  Allergies  Allergen Reactions   Codeine Nausea Only    Family History: No family history on file.  Social History:  reports that she has never smoked. She has never used smokeless tobacco. She reports previous alcohol use. She reports that she does not use drugs.  ROS: All other review of systems were reviewed and are negative except what is noted above in HPI  Physical Exam: BP 118/69   Pulse 86   Ht 5\' 3"  (1.6 m)   Wt 110 lb (49.9 kg)   BMI 19.49 kg/m   Constitutional:  Alert and oriented, No acute distress. HEENT: [place] AT, moist mucus membranes.  Trachea midline, no masses. Cardiovascular: No clubbing, cyanosis, or edema. Respiratory: Normal respiratory effort, no increased work of breathing. GI: Abdomen is soft, nontender, nondistended, no abdominal masses GU: No CVA tenderness.  Lymph: No cervical or inguinal lymphadenopathy. Skin: No rashes, bruises or suspicious lesions. Neurologic: Grossly intact, no focal deficits, moving all 4 extremities. Psychiatric: Normal mood and affect.  Laboratory Data: Lab Results  Component Value Date   WBC 8.1 04/07/2020   HGB 11.6 (L) 04/07/2020   HCT 34.9 (L) 04/07/2020   MCV 91.4 04/07/2020   PLT 258 04/07/2020    Lab Results  Component Value Date   CREATININE 0.65 04/07/2020    No results  found for: PSA  No results found for: TESTOSTERONE  No results found for: HGBA1C  Urinalysis    Component Value Date/Time   COLORURINE YELLOW 04/06/2020 0840   APPEARANCEUR Clear 05/30/2020 1309   LABSPEC 1.020 04/06/2020 0840   PHURINE 5.0 04/06/2020 0840   GLUCOSEU Negative 05/30/2020 1309   HGBUR LARGE (A) 04/06/2020 0840   BILIRUBINUR Negative 05/30/2020 1309   KETONESUR NEGATIVE 04/06/2020 0840   PROTEINUR Negative 05/30/2020 1309   PROTEINUR NEGATIVE 04/06/2020 0840   NITRITE Negative 05/30/2020 1309   NITRITE NEGATIVE 04/06/2020 0840   LEUKOCYTESUR Negative 05/30/2020 1309   LEUKOCYTESUR MODERATE (A) 04/06/2020 0840    Lab Results  Component Value Date   LABMICR Comment 05/30/2020   WBCUA 0-5 04/16/2020   LABEPIT 0-10 04/16/2020   BACTERIA Many (A) 04/16/2020    Pertinent Imaging: KUB today: Images reviewed and discussed with the patient Results for orders placed during the hospital encounter of 05/29/20  Abdomen 1 view (KUB)  Narrative CLINICAL DATA:  Nephrolithiasis. Left ureteral calculus with back pain.  EXAM: ABDOMEN - 1 VIEW  COMPARISON:  Most recent radiograph 04/26/2020. Most recent CT 04/05/2020  FINDINGS: No visualized radiopaque calculi project over the renal beds, course of the ureters or in the pelvis. Normal bowel gas pattern with small volume of colonic stool. No obstruction. No evidence of free air on the supine view. The lung bases are clear. Mild degenerative change of both hips. Osteitis pubis partially included.  IMPRESSION: No visualized radiopaque calculi.   Electronically Signed By: Narda Rutherford M.D. On: 05/30/2020 09:28  No results found for this or any previous visit.  No results found for this or any previous visit.  No results found for this or any previous visit.  No results found for this or any previous visit.  No results found for this or any previous visit.  No results found for this or any previous  visit.  No results found for this or any previous visit.   Assessment & Plan:    1. Renal calculus -Dietary handout given -RTC prn - Urinalysis, Routine w reflex microscopic   No follow-ups on file.  Wilkie Aye, MD  [facility] [place]

## 2022-06-14 IMAGING — DX DG ABDOMEN 1V
1 series · 1 of 1 positions shown · non-contrast
Comparison: 04/10/2020

CLINICAL DATA: Recent lithotripsy for a left-sided ureteral
calculus.

EXAM:
ABDOMEN - 1 VIEW

[abdomen kub]
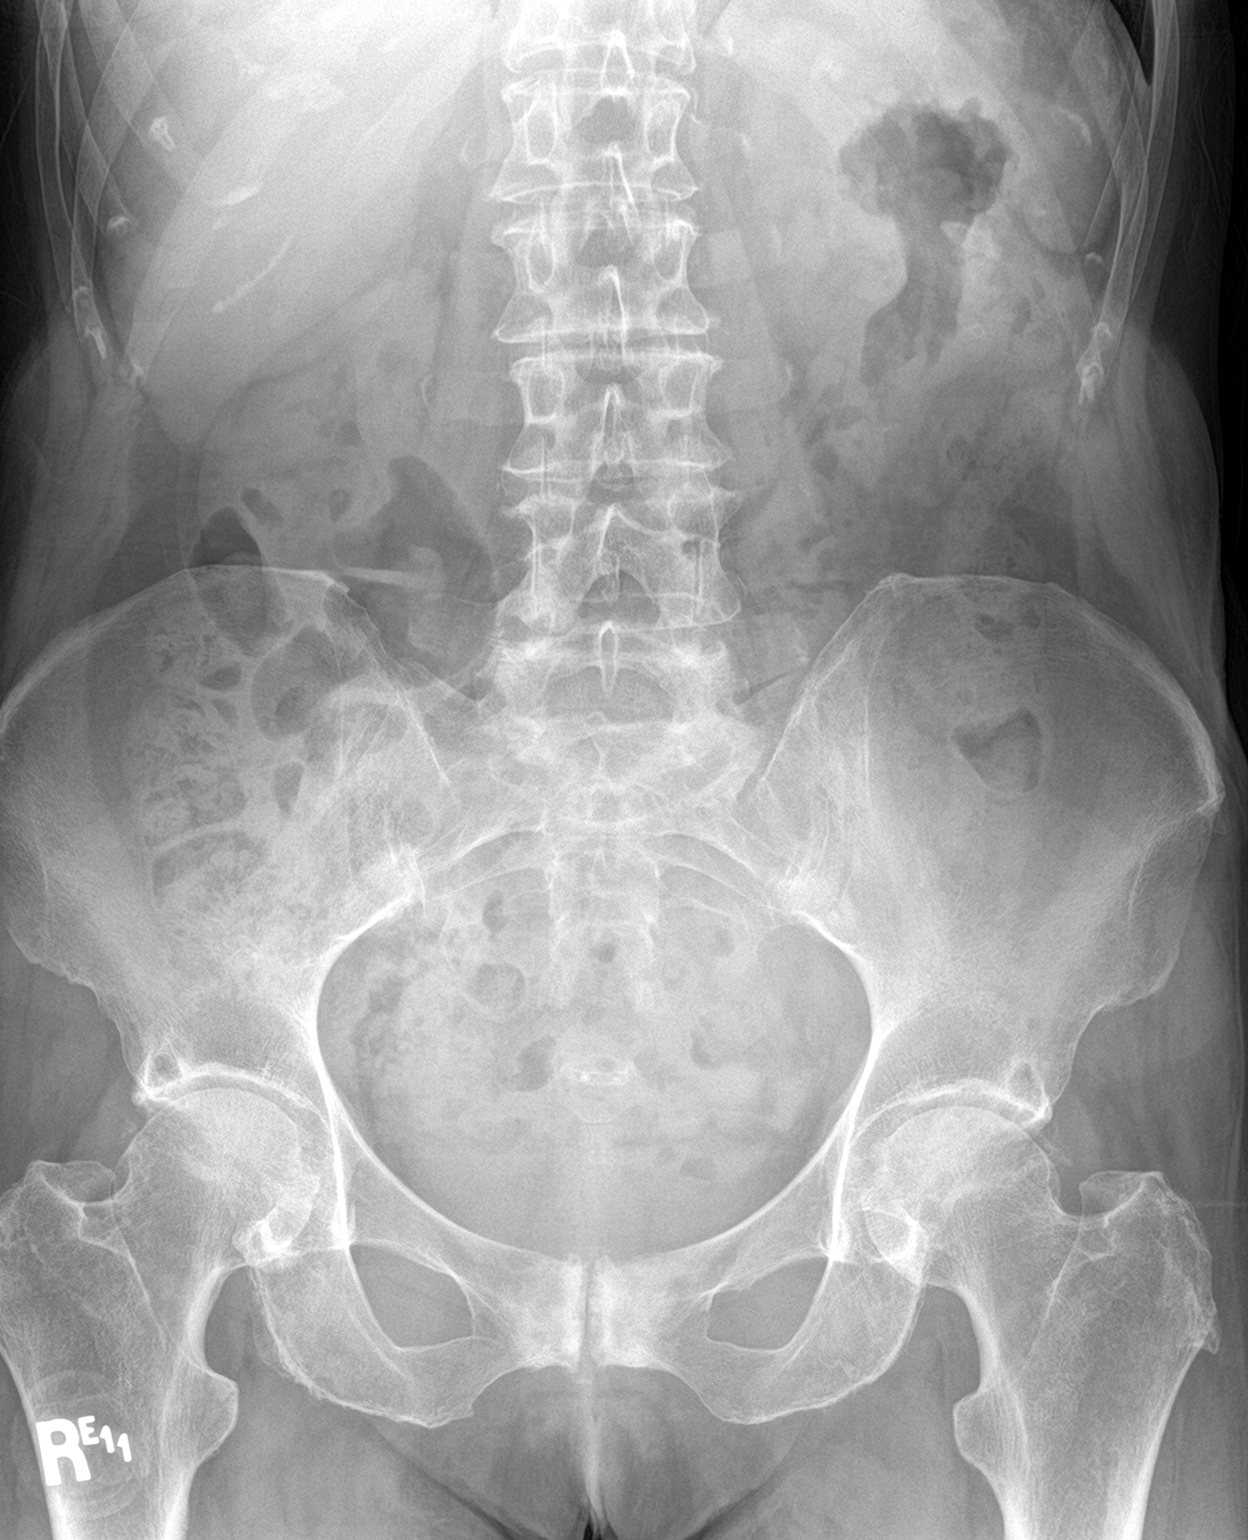

[1 of 1 positions shown; findings below may reference images not displayed]

FINDINGS: The previously seen left ureteral stent has been removed. No
radiographically visible urinary tract calculi seen. Normal bowel
gas pattern. Bilateral osteitis pubis. Bilateral hip subarticular
cyst formation and mild spur formation. Mild lumbar spine
degenerative changes.
IMPRESSION: 1. No radiographically visible urinary tract calculi.
2. Bilateral hip degenerative changes.

## 2023-01-16 IMAGING — DX DG ABDOMEN 1V
2 series · 2 of 2 positions shown · non-contrast
Comparison: May 29, 2020

CLINICAL DATA: Nephrolith masses

EXAM:
ABDOMEN - 1 VIEW

[abdomen kub (1 of 2)]
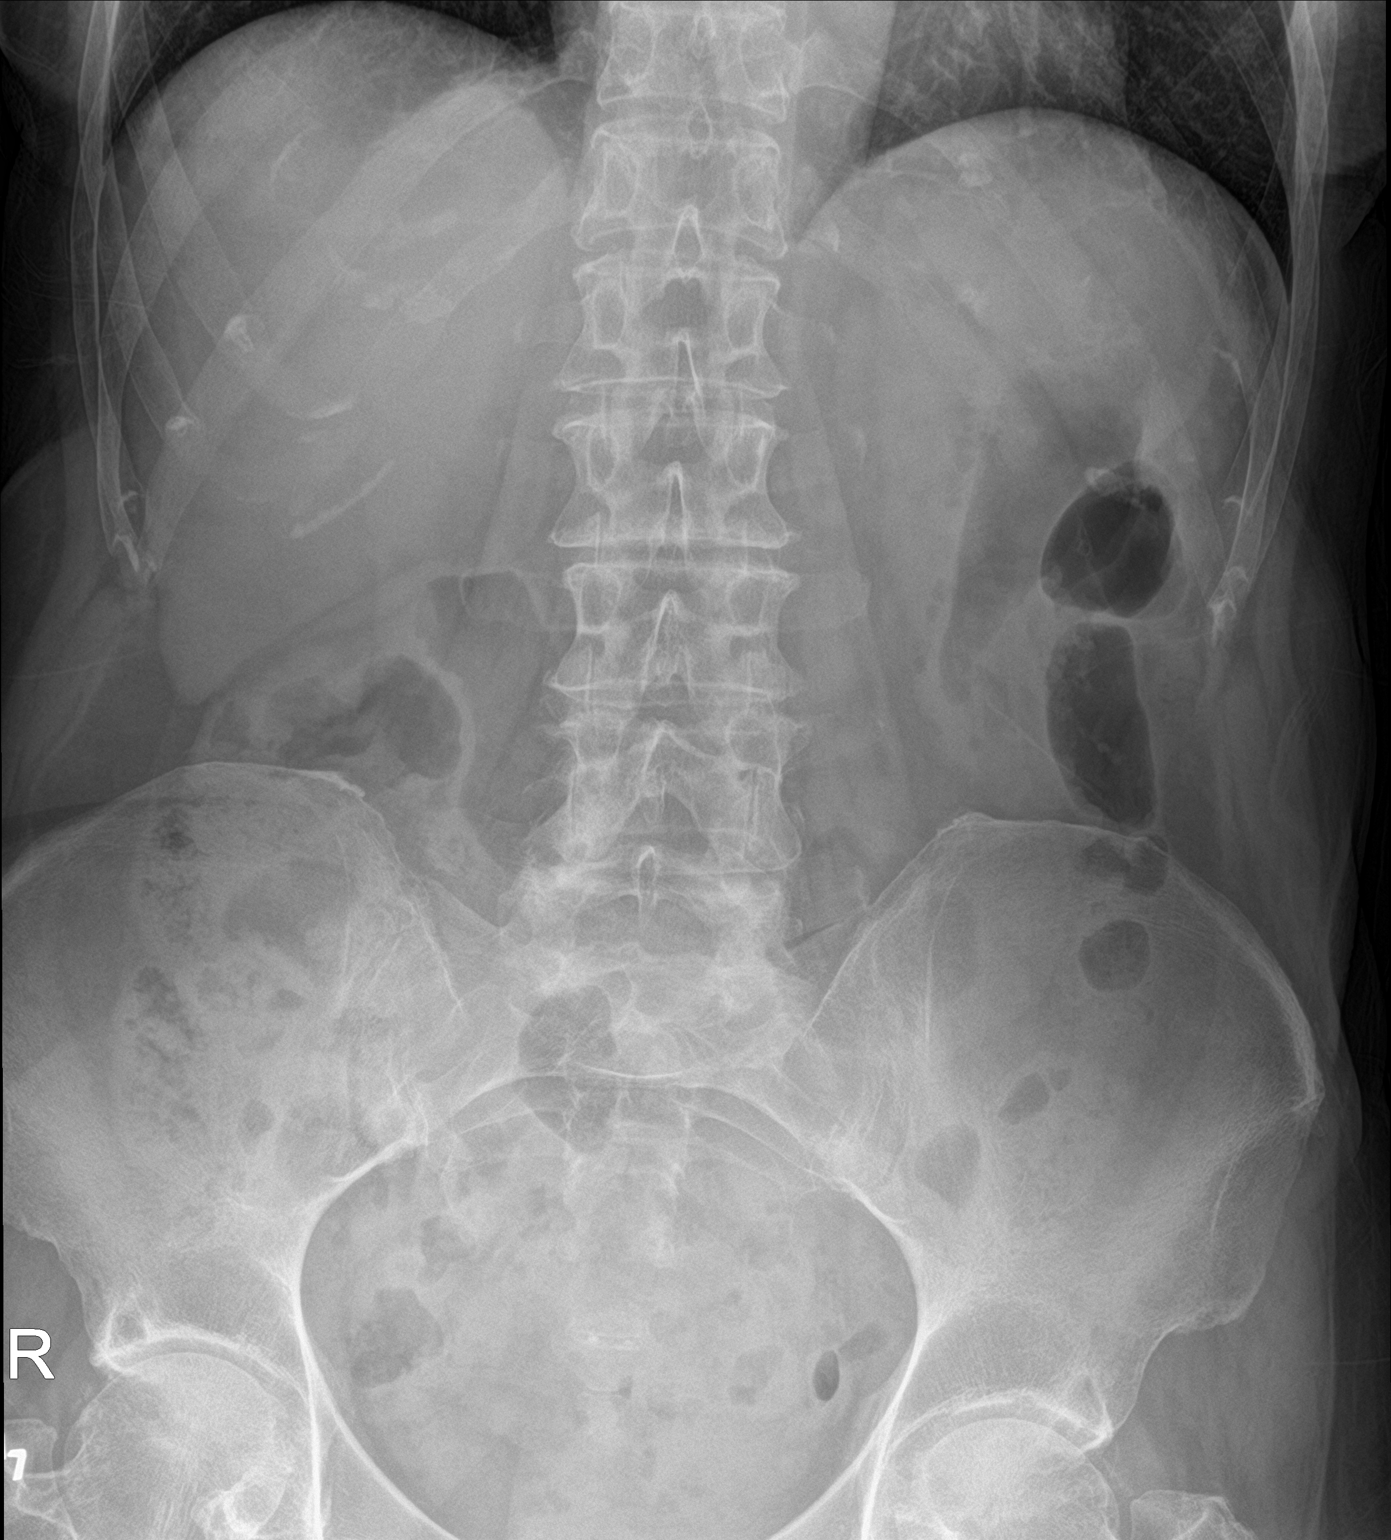

[abdomen kub (2 of 2)]
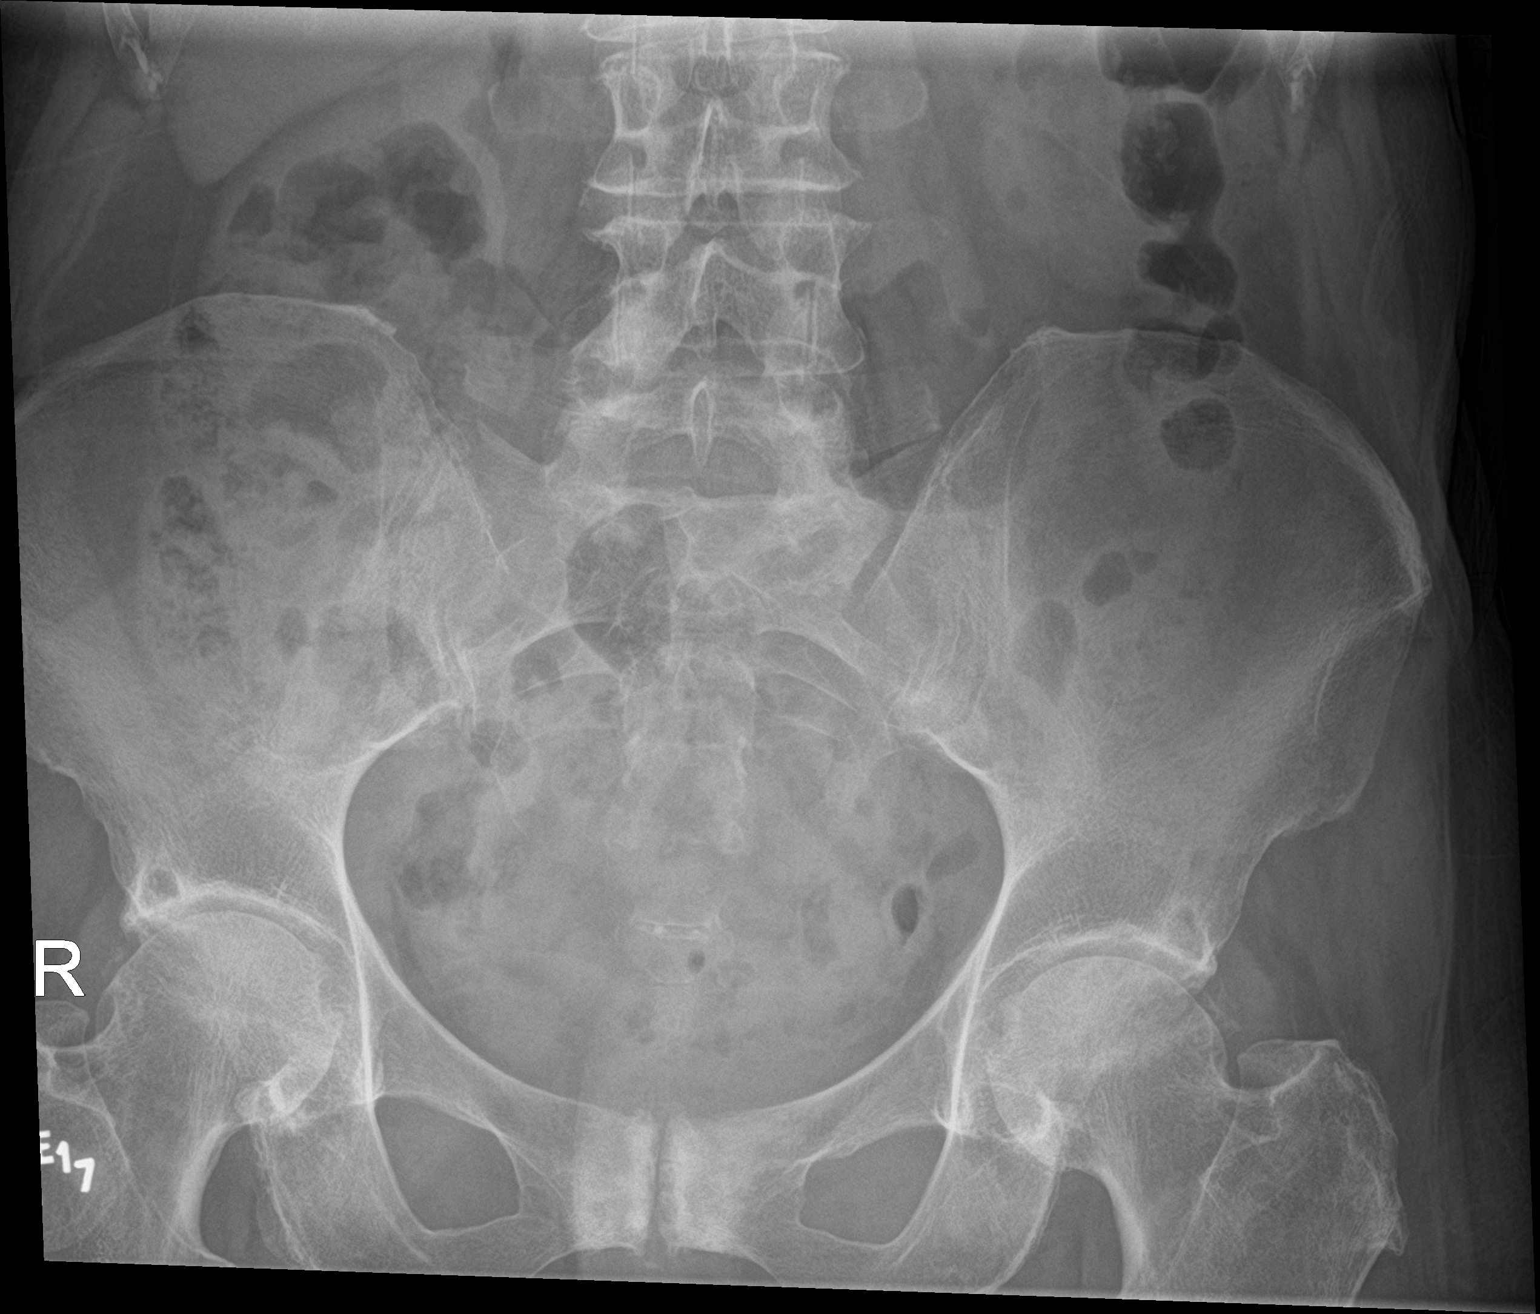

[2 of 2 positions shown; findings below may reference images not displayed]

FINDINGS: No abnormal calcifications evident. Moderate stool in colon. No
bowel dilatation or air-fluid level to suggest bowel obstruction. No
free air. Lung bases clear. There is degenerative change in the
pubic symphysis.
IMPRESSION: No evident abnormal calcifications. No bowel obstruction or free
air. Lung bases clear.
